# Patient Record
Sex: Male | Born: 1960 | Race: White | Hispanic: No | Smoking: Former smoker
Health system: Southern US, Community
[De-identification: ages and names within clinical notes are randomized; demographics above are authoritative.]

## PROBLEM LIST (undated history)

## (undated) DIAGNOSIS — K635 Polyp of colon: Secondary | ICD-10-CM

## (undated) DIAGNOSIS — M109 Gout, unspecified: Secondary | ICD-10-CM

## (undated) DIAGNOSIS — K449 Diaphragmatic hernia without obstruction or gangrene: Secondary | ICD-10-CM

## (undated) HISTORY — PX: APPENDECTOMY: SHX54

## (undated) HISTORY — PX: NM RENAL LASIX (ARMC HX): HXRAD1213

## (undated) HISTORY — DX: Gout, unspecified: M10.9

## (undated) HISTORY — DX: Diaphragmatic hernia without obstruction or gangrene: K44.9

## (undated) HISTORY — DX: Polyp of colon: K63.5

## (undated) HISTORY — PX: NASAL SINUS SURGERY: SHX719

---

## 2015-02-04 ENCOUNTER — Ambulatory Visit
Admit: 2015-02-04 | Disposition: A | Discharge status: Home / Self Care | Payer: Self-pay | Attending: Family Medicine | Admitting: Family Medicine

## 2015-11-05 ENCOUNTER — Telehealth: Payer: Self-pay | Admitting: Family Medicine

## 2015-11-05 NOTE — Telephone Encounter (Signed)
Pt said it was time for his colonoscopy and needs referral to [facility].  He said he could come by and sign to have records sent from gastro in [place].  His call back number is [phone]

## 2015-11-06 NOTE — Telephone Encounter (Signed)
LMTCB Per Amy, patient needs to call ins. Company and make sure they will pay for another colonoscopy within 2 years. Patient had a normal colonoscopy in 2015, 2010.

## 2015-11-22 ENCOUNTER — Telehealth: Payer: Self-pay | Admitting: *Deleted

## 2015-11-22 NOTE — Telephone Encounter (Signed)
Patient needs to come by and sign new medical records release for [facility] [place]. They will not release colonoscopy reports without it. They need release stating he is transfering care.

## 2015-12-02 ENCOUNTER — Encounter: Payer: Self-pay | Admitting: Family Medicine

## 2015-12-02 ENCOUNTER — Ambulatory Visit (INDEPENDENT_AMBULATORY_CARE_PROVIDER_SITE_OTHER): Payer: 59 | Admitting: Family Medicine

## 2015-12-02 VITALS — BP 121/81 | HR 58 | Temp 98.0°F | Resp 16 | Ht 73.0 in | Wt 212.0 lb

## 2015-12-02 DIAGNOSIS — J039 Acute tonsillitis, unspecified: Secondary | ICD-10-CM | POA: Diagnosis not present

## 2015-12-02 DIAGNOSIS — Z1211 Encounter for screening for malignant neoplasm of colon: Secondary | ICD-10-CM | POA: Diagnosis not present

## 2015-12-02 DIAGNOSIS — J029 Acute pharyngitis, unspecified: Secondary | ICD-10-CM | POA: Diagnosis not present

## 2015-12-02 LAB — POCT RAPID STREP A (OFFICE): Rapid Strep A Screen: NEGATIVE

## 2015-12-02 MED ORDER — PENICILLIN V POTASSIUM 500 MG PO TABS: 500.0000 mg | ORAL_TABLET | Freq: Three times a day (TID) | ORAL | Status: DC

## 2015-12-02 MED ORDER — PENICILLIN V POTASSIUM 500 MG PO TABS: 500.0000 mg | ORAL_TABLET | Freq: Three times a day (TID) | ORAL | Status: AC

## 2015-12-02 NOTE — Patient Instructions (Addendum)

## 2015-12-02 NOTE — Progress Notes (Signed)
Subjective:    Patient ID: Eric Hogan, male    DOB: 09/06/1961, 55 y.o.   MRN: [id number]  HPI: Eric Hogan is a 55 y.o. male presenting on 12/02/2015 for Sore Throat   HPI  Pt presents for sore throat and sinus congestion for about 2 weeks. Throat is very sore. No sinus congestion or HA. Some fatigue. Some "swollen glands" Pain to swallow at night. Most painful at night. Lots of post nasal drip and drainage. No regular medication.   Pt is also being told by his GI specialist in [place] that it is time for colonoscopy due to polyps. He is unsure of the last date of colonoscopy. We are requesting records from his [place] GI specialist. He would like colonoscopy at [facility].    Past Medical History  Diagnosis Date  . Gout   . Polyp of colon   . Hiatal hernia     No current outpatient prescriptions on file prior to visit.   No current facility-administered medications on file prior to visit.    Review of Systems  Constitutional: Negative for fever and chills.  HENT: Positive for postnasal drip, sore throat and trouble swallowing. Negative for congestion, ear pain, rhinorrhea and sinus pressure.   Respiratory: Negative for cough, chest tightness, shortness of breath and wheezing.   Cardiovascular: Negative for chest pain, palpitations and leg swelling.  Gastrointestinal: Negative for nausea, vomiting and abdominal pain.  Endocrine: Negative.   Genitourinary: Negative for dysuria, urgency, discharge, penile pain and testicular pain.  Musculoskeletal: Negative for back pain, joint swelling and arthralgias.  Skin: Negative.   Neurological: Negative for dizziness, weakness, numbness and headaches.  Psychiatric/Behavioral: Negative for sleep disturbance and dysphoric mood.   Per HPI unless specifically indicated above     Objective:    BP 121/81 mmHg  Pulse 58  Temp(Src) 98 F (36.7 C) (Oral)  Resp 16  Ht  (1.854 m)  Wt 212 lb (96.163 kg)  BMI 27.98 kg/m2  Wt  Readings from Last 3 Encounters:  12/02/15 212 lb (96.163 kg)    Physical Exam  HENT:  Head: Normocephalic and atraumatic.  Right Ear: Hearing and tympanic membrane normal.  Left Ear: Hearing and tympanic membrane normal.  Nose: No mucosal edema or rhinorrhea. Right sinus exhibits no maxillary sinus tenderness and no frontal sinus tenderness. Left sinus exhibits no maxillary sinus tenderness and no frontal sinus tenderness.  Mouth/Throat: Oropharyngeal exudate and posterior oropharyngeal erythema present.   Results for orders placed or performed in visit on 12/02/15  POCT rapid strep A  Result Value Ref Range   Rapid Strep A Screen Negative Negative      Assessment & Plan:   Problem List Items Addressed This Visit    None    Visit Diagnoses    Exudative tonsillitis    -  Primary    Treat for exudative tonsillitis given pt exudate on tonsils. Penicillin TID for 10 days. Supportive care at home. Return if symptoms not improving.     Relevant Medications    penicillin v potassium (VEETID) 500 MG tablet    Other Relevant Orders    POCT rapid strep A (Completed)    Grp A Strep    Screening for colon cancer        Relevant Orders    Ambulatory referral to Gastroenterology       Meds ordered this encounter  Medications  . DISCONTD: penicillin v potassium (VEETID) 500 MG tablet  Sig: Take 1 tablet (500 mg total) by mouth 3 (three) times daily.    Dispense:  30 tablet    Refill:  0    Order Specific Question:  Supervising Provider    Answer:  Janeann Forehand [phone]  . penicillin v potassium (VEETID) 500 MG tablet    Sig: Take 1 tablet (500 mg total) by mouth 3 (three) times daily.    Dispense:  30 tablet    Refill:  0    Order Specific Question:  Supervising Provider    Answer:  Janeann Forehand [[id number]]      Follow up plan: Return if symptoms worsen or fail to improve.

## 2015-12-04 LAB — CULTURE, GROUP A STREP: STREP A CULTURE: NEGATIVE

## 2016-03-03 ENCOUNTER — Telehealth: Payer: Self-pay | Admitting: Family Medicine

## 2016-03-03 NOTE — Telephone Encounter (Signed)
Amy at Dr. Orpah CobbSkulski's office called to see if there were records of pt's previous colonoscopy and or pathos report.  Her call back number is [phone] and fax [phone]

## 2016-03-03 NOTE — Telephone Encounter (Signed)
Called Amy back and let her know we have not received records from [place].

## 2016-05-12 ENCOUNTER — Encounter: Admission: RE | Payer: Self-pay | Source: Ambulatory Visit

## 2016-05-12 ENCOUNTER — Ambulatory Visit: Admission: RE | Admit: 2016-05-12 | Payer: 59 | Source: Ambulatory Visit | Admitting: Gastroenterology

## 2016-05-12 SURGERY — COLONOSCOPY WITH PROPOFOL
Anesthesia: General

## 2016-05-28 ENCOUNTER — Ambulatory Visit (INDEPENDENT_AMBULATORY_CARE_PROVIDER_SITE_OTHER): Payer: 59 | Admitting: Family Medicine

## 2016-05-28 ENCOUNTER — Ambulatory Visit
Admission: RE | Admit: 2016-05-28 | Discharge: 2016-05-28 | Disposition: A | Discharge status: Home / Self Care | Payer: 59 | Source: Ambulatory Visit | Attending: Family Medicine | Admitting: Family Medicine

## 2016-05-28 ENCOUNTER — Encounter: Payer: Self-pay | Admitting: Family Medicine

## 2016-05-28 ENCOUNTER — Telehealth: Payer: Self-pay | Admitting: Family Medicine

## 2016-05-28 VITALS — BP 118/72 | HR 69 | Temp 98.3°F | Resp 16 | Ht 73.0 in | Wt 210.0 lb

## 2016-05-28 DIAGNOSIS — R05 Cough: Secondary | ICD-10-CM | POA: Insufficient documentation

## 2016-05-28 DIAGNOSIS — R0781 Pleurodynia: Secondary | ICD-10-CM

## 2016-05-28 DIAGNOSIS — R059 Cough, unspecified: Secondary | ICD-10-CM

## 2016-05-28 MED ORDER — BENZONATATE 100 MG PO CAPS: 100.0000 mg | ORAL_CAPSULE | Freq: Three times a day (TID) | ORAL | 0 refills | Status: DC | PRN

## 2016-05-28 MED ORDER — PREDNISONE 20 MG PO TABS: 40.0000 mg | ORAL_TABLET | Freq: Every day | ORAL | 0 refills | Status: DC

## 2016-05-28 MED ORDER — DM-GUAIFENESIN ER 30-600 MG PO TB12: 1.0000 | ORAL_TABLET | Freq: Two times a day (BID) | ORAL | 0 refills | Status: DC

## 2016-05-28 NOTE — Progress Notes (Signed)
Subjective:    Patient ID: Eric Hogan, male    DOB: [birth year], 55 y.o.   MRN: [id number]  HPI: Eric Hogan is a 55 y.o. male presenting on 05/28/2016 for Sinusitis (drainge cough phlem comes up ticking tin throat wheezing cough is still there pt has follow up for ENT tomorrow after sinus surgery 2 years ago but wanted to check out cough and lungs )   HPI  Pt presents for cough. Has sore throat, URI symptoms 2 weeks ago. Cough is lingering. Moved into chest. Harder to take a deep breath. No chest tightness. Was wheezing. Still has cough. Cough is mostly non-productive. Dark brown sputum when does cough. Cough was worse at night. Chest does hurt when he coughs.    Going to ENT tomorrow for follow-up. Sinus issues and cough since surgery.   Past Medical History:  Diagnosis Date  . Gout   . Hiatal hernia   . Polyp of colon     No current outpatient prescriptions on file prior to visit.   No current facility-administered medications on file prior to visit.     Review of Systems  Constitutional: Negative for chills and fever.  HENT: Positive for congestion, ear pain, postnasal drip, rhinorrhea and sore throat. Negative for ear discharge, sinus pressure and trouble swallowing.   Respiratory: Positive for cough and wheezing. Negative for choking, chest tightness and shortness of breath.        Pain with deep inspiration.   Cardiovascular: Negative for chest pain and palpitations.  Gastrointestinal: Negative for abdominal pain, diarrhea, nausea and vomiting.  Musculoskeletal: Negative for myalgias, neck pain and neck stiffness.  Skin: Negative for rash.  Neurological: Negative for dizziness, syncope, weakness and headaches.   Per HPI unless specifically indicated above     Objective:    BP 118/72 (BP Location: Right Arm, Patient Position: Sitting, Cuff Size: Normal)   Pulse 69   Temp 98.3 F (36.8 C) (Oral)   Resp 16   Ht  (1.854 m)   Wt 210 lb (95.3 kg)   SpO2 98%    BMI 27.71 kg/m   Wt Readings from Last 3 Encounters:  05/28/16 210 lb (95.3 kg)  12/02/15 212 lb (96.2 kg)    Physical Exam  Constitutional: He appears well-developed and well-nourished. No distress.  HENT:  Head: Normocephalic and atraumatic.  Right Ear: Hearing and tympanic membrane normal.  Left Ear: Hearing normal.  Nose: Mucosal edema present. No rhinorrhea. Right sinus exhibits no maxillary sinus tenderness and no frontal sinus tenderness. Left sinus exhibits no maxillary sinus tenderness and no frontal sinus tenderness.  Mouth/Throat: Uvula is midline. Mucous membranes are not cyanotic. Posterior oropharyngeal erythema present.  Mildly erythematous throat.   Pulmonary/Chest: He has no decreased breath sounds. He has wheezes (with coughing only.) in the right lower field and the left lower field. He has no rhonchi. He has no rales. Chest wall is not dull to percussion. He exhibits no tenderness.  Skin: He is not diaphoretic.   Results for orders placed or performed in visit on 12/02/15  Culture, Group A Strep  Result Value Ref Range   Strep A Culture Negative   POCT rapid strep A  Result Value Ref Range   Rapid Strep A Screen Negative Negative      Assessment & Plan:   Problem List Items Addressed This Visit    None    Visit Diagnoses    Cough    -  Primary  Supportive care at home. Alarm symptoms reviewed. Return if not improving.    Relevant Medications   predniSONE (DELTASONE) 20 MG tablet   benzonatate (TESSALON) 100 MG capsule   dextromethorphan-guaiFENesin (MUCINEX DM) 30-600 MG 12hr tablet   Other Relevant Orders   DG Chest 2 View   Pleuritic pain       CXR to r/o pneumonia. Symptoms likely consistent with bronchitis. Will treat with prednisone. Supportive care at home. Alarm symptoms reviewed.    Relevant Orders   DG Chest 2 View      Meds ordered this encounter  Medications  . allopurinol (ZYLOPRIM) 100 MG tablet    Sig: Take by mouth.  .  predniSONE (DELTASONE) 20 MG tablet    Sig: Take 2 tablets (40 mg total) by mouth daily with breakfast.    Dispense:  10 tablet    Refill:  0    Order Specific Question:   Supervising Provider    Answer:   Janeann Forehand [phone]  . benzonatate (TESSALON) 100 MG capsule    Sig: Take 1 capsule (100 mg total) by mouth 3 (three) times daily as needed.    Dispense:  30 capsule    Refill:  0    Order Specific Question:   Supervising Provider    Answer:   Janeann Forehand [[id number]]  . dextromethorphan-guaiFENesin (MUCINEX DM) 30-600 MG 12hr tablet    Sig: Take 1 tablet by mouth 2 (two) times daily.    Dispense:  20 tablet    Refill:  0    Order Specific Question:   Supervising Provider    Answer:   Janeann Forehand [[id number]]      Follow up plan: No Follow-up on file.

## 2016-05-28 NOTE — Patient Instructions (Signed)
Please seek immediate medical attention if you develop shortness of breath not relieve by inhaler, chest pain/tightness, fever > 103 F or other concerning symptoms.   You can use supportive care at home to help with your symptoms. I have sent Mucinex DM to your pharmacy to help break up the congestion and soothe your cough. You can takes this twice daily.  I have also sent tesslon perles to your pharmacy to help with the cough- you can take these 3 times daily as needed. Honey is a natural cough suppressant- so add it to your tea in the morning.  If you have a humidifer, set that up in your bedroom at night.     

## 2016-05-28 NOTE — Telephone Encounter (Signed)
Called pt to review XR results. Negative chest XR.

## 2016-08-05 LAB — HM COLONOSCOPY

## 2017-09-02 ENCOUNTER — Encounter: Payer: Self-pay | Admitting: Nurse Practitioner

## 2017-09-02 ENCOUNTER — Ambulatory Visit (INDEPENDENT_AMBULATORY_CARE_PROVIDER_SITE_OTHER): Payer: 59 | Admitting: Nurse Practitioner

## 2017-09-02 VITALS — BP 109/61 | HR 66 | Temp 98.6°F | Resp 16 | Ht 73.0 in | Wt 207.8 lb

## 2017-09-02 DIAGNOSIS — J029 Acute pharyngitis, unspecified: Secondary | ICD-10-CM | POA: Diagnosis not present

## 2017-09-02 DIAGNOSIS — J00 Acute nasopharyngitis [common cold]: Secondary | ICD-10-CM

## 2017-09-02 LAB — POCT RAPID STREP A (OFFICE): Rapid Strep A Screen: NEGATIVE

## 2017-09-02 MED ORDER — FLUTICASONE PROPIONATE 50 MCG/ACT NA SUSP: 2.0000 | Freq: Every day | NASAL | 6 refills | Status: DC

## 2017-09-02 NOTE — Progress Notes (Signed)
Subjective:    Patient ID: Eric Mylaraul H Arey, male    DOB: Jul 05, 1961, 56 y.o.   MRN: [id number]  Eric Hogan is a 56 y.o. male presenting on 09/02/2017 for Sore Throat (hoarsness x 1 week, )   HPI URI Pt reports sore, scratchy throat and hoarseness with symptoms for the last 6-7 days w/ gradual worsening.  Worst in mornings when he wakes up.  Post-nasal drainage leads to "gagging and choking."  Rhinorrhea, but no significant nasal or sinus congestion.  Pt states he does feel like his "ears are plugged." - Pt denies fever, chills, sweats, nausea, vomiting, diarrhea and constipation. - No known sick contacts.  - Not taking any medications to manage symptoms.  Pt does have longstanding history of allergic rhinitis w/ prior ENT surgeries and management.  Has run out of his flonase several months ago and has not been taking this.   Social History   Tobacco Use  . Smoking status: Former Games developermoker  . Smokeless tobacco: Never Used  Substance Use Topics  . Alcohol use: Yes    Alcohol/week: 0.0 oz  . Drug use: No    Review of Systems Per HPI unless specifically indicated above     Objective:    BP 109/61 (BP Location: Right Arm, Patient Position: Sitting, Cuff Size: Normal)   Pulse 66   Temp 98.6 F (37 C) (Oral)   Resp 16   Ht 6\' 1"  (1.854 m)   Wt 207 lb 12.8 oz (94.3 kg)   SpO2 99%   BMI 27.42 kg/m   Wt Readings from Last 3 Encounters:  09/02/17 207 lb 12.8 oz (94.3 kg)  05/28/16 210 lb (95.3 kg)  12/02/15 212 lb (96.2 kg)    Physical Exam  Constitutional: He is oriented to person, place, and time. He appears well-developed and well-nourished. No distress.  HENT:  Head: Normocephalic and atraumatic.  Right Ear: Hearing, external ear and ear canal normal. Tympanic membrane is bulging.  Left Ear: Tympanic membrane is bulging.  Nose: Rhinorrhea present. Right sinus exhibits no maxillary sinus tenderness and no frontal sinus tenderness. Left sinus exhibits no maxillary sinus  tenderness and no frontal sinus tenderness.  Mouth/Throat: Uvula is midline and mucous membranes are normal. Posterior oropharyngeal edema (cobblestoning in appearance) present.  Neck: Normal range of motion. Neck supple. No JVD present. No tracheal deviation present. No thyromegaly present.  Cardiovascular: Normal rate, regular rhythm, normal heart sounds and intact distal pulses.  Pulmonary/Chest: Effort normal and breath sounds normal. No respiratory distress. He has no wheezes.  Lymphadenopathy:    He has cervical adenopathy (mild cervical LAD).  Neurological: He is alert and oriented to person, place, and time.  Skin: Skin is warm and dry.  Psychiatric: He has a normal mood and affect. His behavior is normal. Judgment and thought content normal.  Vitals reviewed.  Results for orders placed or performed in visit on 12/02/15  Culture, Group A Strep  Result Value Ref Range   Strep A Culture Negative   POCT rapid strep A  Result Value Ref Range   Rapid Strep A Screen Negative Negative      Assessment & Plan:   Problem List Items Addressed This Visit    None    Visit Diagnoses    Acute nasopharyngitis (common cold)    -  Primary Acute illness. Without fever. Symptoms gradually worsening since onset 6 days ago. Consistent with viral illness x 6 days with no known sick contacts and no  identifiable focal infections of ears, nose, throat.  Possibly complicated by allergic rhinitis currently unmanaged w/ Flonase as previously.  Plan: 1. Reassurance, likely self-limited with cough lasting up to few weeks - Start Cetirizine 10mg  daily,  - also can use Flonase 2 sprays each nostril daily for up to 4-6 weeks - Start Mucinex-DM OTC up to 7-10 days then stop - May also take OTC Sudafed up to 7 days then stop to control rhinorrhea (normal BP and HR). 2. Supportive care with nasal saline, warm herbal tea with honey, 3. Improve hydration 4. Tylenol / Motrin PRN fevers 5. Return criteria  given    Sore throat     POCT rapid strep to rule out strep pharyngitis.   Relevant Orders   POCT rapid strep A      Meds ordered this encounter  Medications  . DISCONTD: allopurinol (ZYLOPRIM) 100 MG tablet    Sig: Take by mouth.  . fluticasone (FLONASE) 50 MCG/ACT nasal spray    Sig: Place 2 sprays daily into both nostrils.    Dispense:  16 g    Refill:  6    Order Specific Question:   Supervising Provider    Answer:   Smitty CordsKARAMALEGOS, ALEXANDER J [[id number]]      Follow up plan: Return 5-10 if symptoms worsen or fail to improve.  Wilhelmina McardleLauren Hezakiah Champeau, DNP, AGPCNP-BC Adult Gerontology Primary Care Nurse Practitioner [facility] 09/02/2017, 3:36 PM

## 2017-09-02 NOTE — Patient Instructions (Addendum)
Eric Hogan, Thank you for coming in to clinic today.  1. It sounds like you have a Upper Respiratory Virus - this will most likely run it's course in 7 to 10 days. Recommend good hand washing. - Start anti-histamine Cetirizine 10mg  daily take for 2-3 weeks - also can use Flonase 2 sprays each nostril daily for up to 4-6 weeks - If congestion is worse, start OTC Mucinex (or may try Mucinex-DM for cough) up to 7-10 days then stop - For continued drainage, you can start behind the counter Sudafed.  Drink plenty of water. - Drink plenty of fluids to improve congestion - You may try over the counter Nasal Saline spray (Simply Saline, Ocean Spray) as needed to reduce congestion. - Drink warm herbal tea with honey for sore throat. - Start taking Tylenol extra strength 1 to 2 tablets every 6-8 hours for aches or fever/chills for next few days as needed.  Do not take more than 3,000 mg in 24 hours from all medicines.  May take Ibuprofen as well if tolerated 200-400mg  every 8 hours as needed.  If symptoms significantly worsening with persistent fevers/chills despite tylenol/ibpurofen, nausea, vomiting unable to tolerate food/fluids or medicine, body aches, or shortness of breath, sinus pain pressure or worsening productive cough, then follow-up for re-evaluation, may seek more immediate care at Urgent Care or ED if more concerned for emergency.  Please schedule a follow-up appointment with Wilhelmina McardleLauren Shamika Pedregon, AGNP. Return 5-10 if symptoms worsen or fail to improve.  If you have any other questions or concerns, please feel free to call the clinic or send a message through MyChart. You may also schedule an earlier appointment if necessary.  You will receive a survey after today's visit either digitally by e-mail or paper by [employer] mail. Your experiences and feedback matter to us.  Please respond so we know how we are doing as we provide care for you.   Wilhelmina McardleLauren Simcha Farrington, DNP, AGNP-BC Adult Gerontology Nurse  Practitioner [facility], [facility]

## 2018-01-06 ENCOUNTER — Encounter: Payer: Self-pay | Admitting: Nurse Practitioner

## 2018-01-06 ENCOUNTER — Other Ambulatory Visit: Payer: Self-pay

## 2018-01-06 ENCOUNTER — Ambulatory Visit (INDEPENDENT_AMBULATORY_CARE_PROVIDER_SITE_OTHER): Payer: Managed Care, Other (non HMO) | Admitting: Nurse Practitioner

## 2018-01-06 VITALS — BP 111/67 | HR 96 | Temp 99.4°F | Resp 16 | Ht 73.0 in | Wt 205.0 lb

## 2018-01-06 DIAGNOSIS — J0140 Acute pansinusitis, unspecified: Secondary | ICD-10-CM | POA: Diagnosis not present

## 2018-01-06 MED ORDER — AMOXICILLIN-POT CLAVULANATE 875-125 MG PO TABS: 1.0000 | ORAL_TABLET | Freq: Two times a day (BID) | ORAL | 0 refills | Status: AC

## 2018-01-06 NOTE — Progress Notes (Signed)
Subjective:    Patient ID: Eric Hogan, male    DOB: June 29, 1961, 57 y.o.   MRN: [id number]  Eric Hogan is a 57 y.o. male presenting on 01/06/2018 for Nasal Congestion (coughing, nasal drainage w/ bloody mucus, headache, dizziness and hoarseness x 10 days)   HPI  URI Patient presents today for nasal congestion and other symptoms of URI that began 10 days ago.  Started with weakness, malaise.  Then, patient noted he began having worsening nasal drainage with bloody mucus, headache, dizziness, hoarseness, cough.  Over the last 7 days, he has continued to get worse.  Cough is nonproductive.  He has significant nasal drainage with bloody mucous first in the morning. - Headache, dizziness, hoarseness started about 7 days ago. - No current medications, including OTC medications for symptoms. - Pt denies fever, chills, sweats, nausea, vomiting, diarrhea and constipation.  Social History   Tobacco Use  . Smoking status: Former Games developer  . Smokeless tobacco: Never Used  Substance Use Topics  . Alcohol use: Yes    Alcohol/week: 0.0 oz  . Drug use: No    Review of Systems Per HPI unless specifically indicated above     Objective:    BP 111/67 (BP Location: Right Arm, Patient Position: Sitting, Cuff Size: Large)   Pulse 96   Temp 99.4 F (37.4 C) (Oral)   Resp 16   Ht 6\' 1"  (1.854 m)   Wt 205 lb (93 kg)   SpO2 99%   BMI 27.05 kg/m   Wt Readings from Last 3 Encounters:  01/06/18 205 lb (93 kg)  09/02/17 207 lb 12.8 oz (94.3 kg)  05/28/16 210 lb (95.3 kg)    Physical Exam  Constitutional: He appears well-developed and well-nourished. He appears distressed (mildly).  HENT:  Head: Normocephalic and atraumatic.  Right Ear: Hearing, tympanic membrane, external ear and ear canal normal.  Left Ear: Hearing, tympanic membrane, external ear and ear canal normal.  Nose: Mucosal edema and rhinorrhea present. Right sinus exhibits maxillary sinus tenderness and frontal sinus  tenderness. Left sinus exhibits maxillary sinus tenderness and frontal sinus tenderness.  Mouth/Throat: Uvula is midline and mucous membranes are normal. Posterior oropharyngeal edema (cobblestoning) and posterior oropharyngeal erythema (Mildly injected) present. Oropharyngeal exudate: clear secretions.  Neck: Normal range of motion. Neck supple.  Cardiovascular: Normal rate, regular rhythm, S1 normal, S2 normal, normal heart sounds and intact distal pulses.  Pulmonary/Chest: Effort normal and breath sounds normal. No respiratory distress.  Lymphadenopathy:    He has cervical adenopathy.  Neurological: He is alert.  Skin: Skin is warm and dry.  Psychiatric: He has a normal mood and affect. His behavior is normal.  Vitals reviewed.  Results for orders placed or performed in visit on 09/02/17  POCT rapid strep A  Result Value Ref Range   Rapid Strep A Screen Negative Negative      Assessment & Plan:   Problem List Items Addressed This Visit    None    Visit Diagnoses    Acute non-recurrent pansinusitis    -  Primary    Consistent with URI and secondary sinusitis with symptoms worsening over the past 7 days and initial symptoms of nasal congestion and sinus pressure over 2 weeks ago.   Plan: 1.START taking Augmentin 875-125 mg tablets every 12 hours for 10 days.  Discussed completing antibiotic. - While on antibiotic, take a probiotic OTC or from food. -Start anti-histamine cetirizine 10mg  daily. - Can use Flonase 2 sprays each  nostril daily for up to 4-6 weeks if no epistaxis. - Start Mucinex-DM OTC for  7-10 days prn congestion 2. Supportive care with nasal saline, warm herbal tea with honey, 3. Improve hydration 4. Tylenol / Motrin PRN fevers  5. Return criteria given    Meds ordered this encounter  Medications  . amoxicillin-clavulanate (AUGMENTIN) 875-125 MG tablet    Sig: Take 1 tablet by mouth 2 (two) times daily for 10 days.    Dispense:  20 tablet    Refill:  0     Order Specific Question:   Supervising Provider    Answer:   Smitty CordsKARAMALEGOS, ALEXANDER J [[id number]]     Follow up plan: Return 5-7 days if symptoms worsen or fail to improve.   Eric McardleLauren Brynlie Daza, DNP, AGPCNP-BC Adult Gerontology Primary Care Nurse Practitioner [facility] 01/22/2018, 10:01 PM

## 2018-01-06 NOTE — Patient Instructions (Addendum)
Eric Hogan, Thank you for coming in to clinic today.  1. It sounds like you have an upper respiratory virus.  This will most likely run it's course in 7 to 10 days. Recommend good hand washing. - START Augmentin 875-125 mg tablet.  Take one tablet twice daily for 10 days.  - Start anti-histamine cetirizine 10mg  daily, also can use Flonase 2 sprays each nostril daily for up to 4-6 weeks - If congestion is worse, start OTC Mucinex (or may try Mucinex-DM for cough) up to 7-10 days then stop. - Drink plenty of fluids to improve congestion. - You may try over the counter Nasal Saline spray (Simply Saline, Ocean Spray) as needed to reduce congestion. - Drink warm herbal tea with honey for sore throat. - Start taking Tylenol extra strength 1 to 2 tablets every 6-8 hours for aches or fever/chills for next few days as needed.  Do not take more than 3,000 mg in 24 hours from all medicines.  May take Ibuprofen as well if tolerated 200-400mg  every 8 hours as needed.  If symptoms significantly worsening with persistent fevers/chills despite tylenol/ibpurofen, nausea, vomiting unable to tolerate food/fluids or medicine, body aches, or shortness of breath, sinus pain pressure or worsening productive cough, then follow-up for re-evaluation, may seek more immediate care at Urgent Care or ED if more concerned for emergency.  Please schedule a follow-up appointment with Wilhelmina McardleLauren Trinidee Schrag, AGNP. Return 5-7 days if symptoms worsen or fail to improve.  If you have any other questions or concerns, please feel free to call the clinic or send a message through MyChart. You may also schedule an earlier appointment if necessary.  You will receive a survey after today's visit either digitally by e-mail or paper by [employer] mail. Your experiences and feedback matter to us.  Please respond so we know how we are doing as we provide care for you.   Wilhelmina McardleLauren Grayce Budden, DNP, AGNP-BC Adult Gerontology Nurse Practitioner [facility], [facility]

## 2018-01-22 ENCOUNTER — Encounter: Payer: Self-pay | Admitting: Nurse Practitioner

## 2018-04-22 ENCOUNTER — Ambulatory Visit (INDEPENDENT_AMBULATORY_CARE_PROVIDER_SITE_OTHER): Payer: Managed Care, Other (non HMO) | Admitting: Nurse Practitioner

## 2018-04-22 ENCOUNTER — Encounter: Payer: Self-pay | Admitting: Nurse Practitioner

## 2018-04-22 ENCOUNTER — Other Ambulatory Visit: Payer: Self-pay

## 2018-04-22 VITALS — BP 124/75 | HR 79 | Temp 98.3°F | Ht 73.0 in | Wt 211.0 lb

## 2018-04-22 DIAGNOSIS — R197 Diarrhea, unspecified: Secondary | ICD-10-CM | POA: Diagnosis not present

## 2018-04-22 DIAGNOSIS — R160 Hepatomegaly, not elsewhere classified: Secondary | ICD-10-CM

## 2018-04-22 DIAGNOSIS — R82998 Other abnormal findings in urine: Secondary | ICD-10-CM | POA: Diagnosis not present

## 2018-04-22 DIAGNOSIS — N200 Calculus of kidney: Secondary | ICD-10-CM | POA: Insufficient documentation

## 2018-04-22 DIAGNOSIS — R109 Unspecified abdominal pain: Secondary | ICD-10-CM

## 2018-04-22 LAB — POCT URINALYSIS DIPSTICK
Bilirubin, UA: NEGATIVE
Glucose, UA: NEGATIVE
Ketones, UA: NEGATIVE
Leukocytes, UA: NEGATIVE
Nitrite, UA: NEGATIVE
Protein, UA: NEGATIVE
Spec Grav, UA: 1.02 (ref 1.010–1.025)
Urobilinogen, UA: 0.2 E.U./dL
pH, UA: 5 (ref 5.0–8.0)

## 2018-04-22 NOTE — Patient Instructions (Addendum)
Eric Hogan,   Thank you for coming in to clinic today.  1. You may have a kidney stone. - Drink plenty of water - Take tylenol or ibuprofen if needed.   - If pain very severe again, seek care at Urgent Care first or Emergency Room if all other offices are closed.  Please schedule a follow-up appointment with Wilhelmina Mcardle, AGNP. Return 2-4 weeks if symptoms worsen or fail to improve.  If you have any other questions or concerns, please feel free to call the clinic or send a message through MyChart. You may also schedule an earlier appointment if necessary.  You will receive a survey after today's visit either digitally by e-mail or paper by [employer]. Your experiences and feedback matter to [place].  Please respond so we know how we are doing as we provide care for you.   Wilhelmina Mcardle, DNP, AGNP-BC Adult Gerontology Nurse Practitioner [facility], [facility]   Kidney Stones Kidney stones (urolithiasis) are solid, rock-like deposits that form inside of the organs that make urine (kidneys). A kidney stone may form in a kidney and move into the bladder, where it can cause intense pain and block the flow of urine. Kidney stones are created when high levels of certain minerals are found in the urine. They are usually passed through urination, but in some cases, medical treatment may be needed to remove them. What are the causes? Kidney stones may be caused by:  A condition in which certain glands produce too much parathyroid hormone (primary hyperparathyroidism), which causes too much calcium buildup in the blood.  Buildup of uric acid crystals in the bladder (hyperuricosuria). Uric acid is a chemical that the body produces when you eat certain foods. It usually exits the body in the urine.  Narrowing (stricture) of one or both of the tubes that drain urine from the kidneys to the bladder (ureters).  A kidney blockage that is present at birth (congenital obstruction).  Past  surgery on the kidney or the ureters, such as gastric bypass surgery.  What increases the risk? The following factors make you more likely to develop kidney stones:  Having had a kidney stone in the past.  Having a family history of kidney stones.  Not drinking enough water.  Eating a diet that is high in protein, salt (sodium), or sugar.  Being overweight or obese.  What are the signs or symptoms? Symptoms of a kidney stone may include:  Nausea.  Vomiting.  Blood in the urine (hematuria).  Pain in the side of the abdomen, right below the ribs (flank pain). Pain usually spreads (radiates) to the groin.  Needing to urinate frequently or urgently.  How is this diagnosed? This condition may be diagnosed based on:  Your medical history.  A physical exam.  Blood tests.  Urine tests.  CT scan.  Abdominal X-ray.  A procedure to examine the inside of the bladder (cystoscopy).  How is this treated? Treatment for kidney stones depends on the size, location, and makeup of the stones. Treatment may involve:  Analyzing your urine before and after you pass the stone through urination.  Being monitored at the hospital until you pass the stone through urination.  Increasing your fluid intake and decreasing the amount of calcium and protein in your diet.  A procedure to break up kidney stones in the bladder using: ? A focused beam of light (laser therapy). ? Shock waves (extracorporeal shock wave lithotripsy).  Surgery to remove kidney  stones. This may be needed if you have severe pain or have stones that block your urinary tract.  Follow these instructions at home: Eating and drinking   Drink enough fluid to keep your urine clear or pale yellow. This will help you to pass the kidney stone.  If directed, change your diet. This may include: ? Limiting how much sodium you eat. ? Eating more fruits and vegetables. ? Limiting how much meat, poultry, fish, and eggs you  eat.  Follow instructions from your health care provider about eating or drinking restrictions. General instructions  Collect urine samples as told by your health care provider. You may need to collect a urine sample: ? 24 hours after you pass the stone. ? 8-12 weeks after passing the kidney stone, and every 6-12 months after that.  Strain your urine every time you urinate, for as long as directed. Use the strainer that your health care provider recommends.  Do not throw out the kidney stone after passing it. Keep the stone so it can be tested by your health care provider. Testing the makeup of your kidney stone may help prevent you from getting kidney stones in the future.  Take over-the-counter and prescription medicines only as told by your health care provider.  Keep all follow-up visits as told by your health care provider. This is important. You may need follow-up X-rays or ultrasounds to make sure that your stone has passed. How is this prevented? To prevent another kidney stone:  Drink enough fluid to keep your urine clear or pale yellow. This is the best way to prevent kidney stones.  Eat a healthy diet and follow recommendations from your health care provider about foods to avoid. You may be instructed to eat a low-protein diet. Recommendations vary depending on the type of kidney stone that you have.  Maintain a healthy weight.  Contact a health care provider if:  You have pain that gets worse or does not get better with medicine. Get help right away if:  You have a fever or chills.  You develop severe pain.  You develop new abdominal pain.  You faint.  You are unable to urinate. This information is not intended to replace advice given to you by your health care provider. Make sure you discuss any questions you have with your health care provider. Document Released: 10/12/2005 Document Revised: 05/01/2016 Document Reviewed: 03/27/2016 Elsevier Interactive Patient  Education  [employer]2018 Elsevier Inc.

## 2018-04-22 NOTE — Progress Notes (Signed)
Subjective:    Patient ID: Eric Hogan, male    DOB: 02/11/1961, 57 y.o.   MRN: [id number]  Eric Hogan is a 57 y.o. male presenting on 04/22/2018 for Abdominal Pain (x 3-4 weeks ago, but it subsided for about a week and then returned for a day a week later and  subsided. On yesterday, severe left side flank pain w/ gross hematuria.  Pt states this not the first time he notice blood in urine. )   HPI Abdominal Pain Pt reports mild abdominal pain with diarrhea approximately 4 weeks ago he attributed to food poisoning.  He has continued to have diarrhea intermittently every 1-2 weeks.  Returned again yesterday, but was also associated with severe left side flank and pelvic pain with darkening of urine.  Denies any gross hematuria.  He reports single drop of bloody urine several years ago, but no other gross hematuria.  He has never had blood with bright red color in toilet bowl.   - No prior kidney stones. - Pt denies dysuria, burning on urination, bladder pressure, urinary frequency, and difficulty with urination. - Pt denies fever, chills, sweats, nausea, vomiting, and constipation.   Social History   Tobacco Use  . Smoking status: Former Smoker    Last attempt to quit: 02/20/2018    Years since quitting: 0.1  . Smokeless tobacco: Never Used  Substance Use Topics  . Alcohol use: Yes    Alcohol/week: 0.0 oz  . Drug use: No    Review of Systems Per HPI unless specifically indicated above     Objective:    BP 124/75 (BP Location: Right Arm, Patient Position: Sitting, Cuff Size: Normal)   Pulse 79   Temp 98.3 F (36.8 C) (Oral)   Ht 6\' 1"  (1.854 m)   Wt 211 lb (95.7 kg)   BMI 27.84 kg/m   Wt Readings from Last 3 Encounters:  04/22/18 211 lb (95.7 kg)  01/06/18 205 lb (93 kg)  09/02/17 207 lb 12.8 oz (94.3 kg)    Physical Exam  Constitutional: He is oriented to person, place, and time. He appears well-developed and well-nourished. No distress.  HENT:  Head:  Normocephalic and atraumatic.  Cardiovascular: Normal rate, regular rhythm, S1 normal, S2 normal, normal heart sounds and intact distal pulses.  Pulmonary/Chest: Effort normal and breath sounds normal. No respiratory distress.  Abdominal: Soft. Normal appearance. He exhibits no distension, no pulsatile liver and no mass. Bowel sounds are increased. There is no hepatosplenomegaly. There is generalized tenderness (sharply tender LLQ near iliac crest) and tenderness in the left lower quadrant. There is no rigidity, no rebound, no guarding, no CVA tenderness, no tenderness at McBurney's point and negative Murphy's sign. No hernia.  Neurological: He is alert and oriented to person, place, and time.  Skin: Skin is warm and dry. Capillary refill takes less than 2 seconds.  Psychiatric: He has a normal mood and affect. His behavior is normal.  Vitals reviewed.   Results for orders placed or performed in visit on 04/22/18  Urinalysis, microscopic only  Result Value Ref Range   WBC, UA NONE SEEN 0 - 5 /HPF   RBC / HPF 0-2 0 - 2 /HPF   Squamous Epithelial / LPF NONE SEEN < OR = 5 /HPF   Bacteria, UA NONE SEEN NONE SEEN /HPF   Hyaline Cast NONE SEEN NONE SEEN /LPF  POCT Urinalysis Dipstick  Result Value Ref Range   Color, UA dark orange  Clarity, UA clear    Glucose, UA Negative Negative   Bilirubin, UA negative    Ketones, UA negative    Spec Grav, UA 1.020 1.010 - 1.025   Blood, UA large    pH, UA 5.0 5.0 - 8.0   Protein, UA Negative Negative   Urobilinogen, UA 0.2 0.2 or 1.0 E.U./dL   Nitrite, UA negative    Leukocytes, UA Negative Negative   Appearance     Odor        Assessment & Plan:   Problem List Items Addressed This Visit    None    Visit Diagnoses    Dark brown urine    -  Primary   Relevant Orders   Urine Culture   POCT Urinalysis Dipstick (Completed)   CT RENAL STONE STUDY   Urinalysis, microscopic only (Completed)   Acute left flank pain       Relevant Orders    Urine Culture   POCT Urinalysis Dipstick (Completed)   CT RENAL STONE STUDY   Urinalysis, microscopic only (Completed)   Diarrhea, unspecified type        #1 - Acute left flank and pelvic pain 1 day ago.  Now resolved associated with dark brown urine.  Likely nephrolithiasis, but cannot exclude urinary tract infection or malignancy as POCT UA positive for blood. - POCT UA - Urine culture and micro to confirm blood - CT renal stone study - Referral urology - CMP, CBC  #2 - Diarrhea - No resolved, but is recurring over last 1 month.  Cannot exclude IBS/IBD/functional diarrhea.  Unlikely infectious. - Increase fiber supplement similar to metamucil to 1/2 dose daily (pt currently taking 1/4 dose) - Consider GI referral. Last colonoscopy 2017 was negative and likely symptoms are not related to malignancy. - Followup if symptoms persist.  Follow up plan: Return 2-4 weeks if symptoms worsen or fail to improve.  Wilhelmina McardleLauren Saadia Dewitt, DNP, AGPCNP-BC Adult Gerontology Primary Care Nurse Practitioner [facility] 04/23/2018, 2:43 PM

## 2018-04-23 ENCOUNTER — Encounter: Payer: Self-pay | Admitting: Nurse Practitioner

## 2018-04-23 LAB — URINALYSIS, MICROSCOPIC ONLY
Bacteria, UA: NONE SEEN /HPF
Hyaline Cast: NONE SEEN /LPF
Squamous Epithelial / LPF: NONE SEEN /HPF (ref <=5)
WBC, UA: NONE SEEN /HPF (ref 0–5)

## 2018-04-24 LAB — URINE CULTURE
MICRO NUMBER:: 90777682
SPECIMEN QUALITY:: ADEQUATE

## 2018-04-25 ENCOUNTER — Telehealth: Payer: Self-pay

## 2018-04-25 NOTE — Telephone Encounter (Signed)
Attempted to contact the pt, no answer. LMOM to return my call.  

## 2018-04-25 NOTE — Telephone Encounter (Signed)
Yes. The bloodwork is still needed to screen kidney function and calcium levels that could be changed or impact future stone risk.

## 2018-04-25 NOTE — Telephone Encounter (Signed)
The pt was notified of your request for labs. The pt state he past two stones over the weekend and wanted to know if he should still get the bloodwork.

## 2018-04-25 NOTE — Telephone Encounter (Signed)
-----   Message from Eric ManilaLauren Renee Kennedy, NP sent at 04/23/2018  2:48 PM EDT ----- Regarding: Add labs to plan Please inform [name] I have decided to draw labs to check blood counts, kidney, liver, and electrolyte function for additional evaluation.  He [facility]ing [facility] or change to [facility] if he prefers.

## 2018-04-26 ENCOUNTER — Encounter: Payer: Self-pay | Admitting: Nurse Practitioner

## 2018-04-26 ENCOUNTER — Other Ambulatory Visit: Payer: Self-pay | Admitting: Nurse Practitioner

## 2018-04-26 DIAGNOSIS — R197 Diarrhea, unspecified: Secondary | ICD-10-CM

## 2018-04-26 DIAGNOSIS — R82998 Other abnormal findings in urine: Secondary | ICD-10-CM

## 2018-04-26 DIAGNOSIS — R109 Unspecified abdominal pain: Secondary | ICD-10-CM

## 2018-04-26 DIAGNOSIS — K449 Diaphragmatic hernia without obstruction or gangrene: Secondary | ICD-10-CM | POA: Insufficient documentation

## 2018-04-26 NOTE — Telephone Encounter (Signed)
patient advised as per Leotis ShamesLauren wanted to go to [facility] for blood work.

## 2018-04-26 NOTE — Telephone Encounter (Signed)
Left message for patient to call back  

## 2018-04-28 LAB — CBC WITH DIFFERENTIAL/PLATELET
Basophils Absolute: 0 10*3/uL (ref 0.0–0.2)
Basos: 1 %
EOS (ABSOLUTE): 0.3 10*3/uL (ref 0.0–0.4)
Eos: 4 %
Hematocrit: 41 % (ref 37.5–51.0)
Hemoglobin: 14.3 g/dL (ref 13.0–17.7)
Immature Grans (Abs): 0.1 10*3/uL (ref 0.0–0.1)
Immature Granulocytes: 1 %
Lymphocytes Absolute: 1.6 10*3/uL (ref 0.7–3.1)
Lymphs: 22 %
MCH: 30.8 pg (ref 26.6–33.0)
MCHC: 34.9 g/dL (ref 31.5–35.7)
MCV: 88 fL (ref 79–97)
Monocytes Absolute: 0.5 10*3/uL (ref 0.1–0.9)
Monocytes: 7 %
Neutrophils Absolute: 4.8 10*3/uL (ref 1.4–7.0)
Neutrophils: 65 %
Platelets: 212 10*3/uL (ref 150–450)
RBC: 4.64 x10E6/uL (ref 4.14–5.80)
RDW: 13.2 % (ref 12.3–15.4)
WBC: 7.3 10*3/uL (ref 3.4–10.8)

## 2018-04-28 LAB — COMPREHENSIVE METABOLIC PANEL
ALT: 37 IU/L (ref 0–44)
AST: 23 IU/L (ref 0–40)
Albumin/Globulin Ratio: 2.2 (ref 1.2–2.2)
Albumin: 4.6 g/dL (ref 3.5–5.5)
Alkaline Phosphatase: 87 IU/L (ref 39–117)
BUN/Creatinine Ratio: 14 (ref 9–20)
BUN: 13 mg/dL (ref 6–24)
Bilirubin Total: 0.2 mg/dL (ref 0.0–1.2)
CO2: 21 mmol/L (ref 20–29)
Calcium: 9.2 mg/dL (ref 8.7–10.2)
Chloride: 103 mmol/L (ref 96–106)
Creatinine, Ser: 0.93 mg/dL (ref 0.76–1.27)
GFR calc Af Amer: 106 mL/min/{1.73_m2} (ref >59)
GFR calc non Af Amer: 92 mL/min/{1.73_m2} (ref >59)
Globulin, Total: 2.1 g/dL (ref 1.5–4.5)
Glucose: 127 mg/dL — ABNORMAL HIGH (ref 65–99)
Potassium: 4.3 mmol/L (ref 3.5–5.2)
Sodium: 140 mmol/L (ref 134–144)
Total Protein: 6.7 g/dL (ref 6.0–8.5)

## 2018-05-05 ENCOUNTER — Telehealth: Payer: Self-pay

## 2018-05-05 NOTE — Telephone Encounter (Signed)
We can defer CT study to Urology appointment.  Thanks for doing PA.

## 2018-05-05 NOTE — Telephone Encounter (Signed)
The pt Ct Soundra PilonStone Study is requiring a Audiological scientisteer-to-Peer [phone]. The pt is scheduled [facility]) in about a week.

## 2018-05-06 NOTE — Telephone Encounter (Signed)
The CT scan was approved and scheduled. Pending notification to patient.

## 2018-05-13 ENCOUNTER — Telehealth: Payer: Self-pay | Admitting: Family Medicine

## 2018-05-13 ENCOUNTER — Ambulatory Visit
Admission: RE | Admit: 2018-05-13 | Discharge: 2018-05-13 | Disposition: A | Discharge status: Home / Self Care | Payer: Managed Care, Other (non HMO) | Source: Ambulatory Visit | Attending: Nurse Practitioner | Admitting: Nurse Practitioner

## 2018-05-13 DIAGNOSIS — R911 Solitary pulmonary nodule: Secondary | ICD-10-CM | POA: Insufficient documentation

## 2018-05-13 DIAGNOSIS — I700 Atherosclerosis of aorta: Secondary | ICD-10-CM | POA: Insufficient documentation

## 2018-05-13 DIAGNOSIS — K769 Liver disease, unspecified: Secondary | ICD-10-CM | POA: Insufficient documentation

## 2018-05-13 DIAGNOSIS — M879 Osteonecrosis, unspecified: Secondary | ICD-10-CM | POA: Diagnosis not present

## 2018-05-13 DIAGNOSIS — R109 Unspecified abdominal pain: Secondary | ICD-10-CM | POA: Diagnosis present

## 2018-05-13 DIAGNOSIS — R82998 Other abnormal findings in urine: Secondary | ICD-10-CM | POA: Insufficient documentation

## 2018-05-13 DIAGNOSIS — N200 Calculus of kidney: Secondary | ICD-10-CM | POA: Insufficient documentation

## 2018-05-13 NOTE — Telephone Encounter (Signed)
Pt has a CT scheduled for 3:00 today and he said his insurance company needs more information.  Pt's call back number is [phone]

## 2018-05-13 NOTE — Telephone Encounter (Signed)
I sent a skye message to Retta Dionesenise Beasley Referral Coordinator concerning this message.

## 2018-05-17 ENCOUNTER — Telehealth: Payer: Self-pay | Admitting: Student

## 2018-05-17 NOTE — Telephone Encounter (Signed)
Pt asked if Lauren had been able to look at CT results and if he needed to come in for the referrals.  Please call  [phone]

## 2018-05-18 NOTE — Addendum Note (Signed)
Addended by: Wilhelmina McardleKENNEDY, Xaden Kaufman R on: 05/18/2018 01:30 PM   Modules accepted: Orders

## 2018-05-18 NOTE — Telephone Encounter (Signed)
Results are reviewed and released to MyChart.  Called patient with no answer.  LM to return call if questions remain.  Review of chart after release of results shows referral to Dr. [facility]is [facility] has already been made.  - I will order RUQ ultrasound if patient desires to proceed with additional workup of liver mass.

## 2018-05-18 NOTE — Telephone Encounter (Signed)
Incoming message concerning CT Stone study

## 2018-05-23 ENCOUNTER — Ambulatory Visit
Admission: RE | Admit: 2018-05-23 | Discharge: 2018-05-23 | Disposition: A | Discharge status: Home / Self Care | Payer: Managed Care, Other (non HMO) | Source: Ambulatory Visit | Attending: Nurse Practitioner | Admitting: Nurse Practitioner

## 2018-05-23 DIAGNOSIS — R160 Hepatomegaly, not elsewhere classified: Secondary | ICD-10-CM | POA: Insufficient documentation

## 2018-05-25 ENCOUNTER — Encounter: Payer: Self-pay | Admitting: Nurse Practitioner

## 2018-05-25 ENCOUNTER — Other Ambulatory Visit: Payer: Self-pay | Admitting: Nurse Practitioner

## 2018-05-25 ENCOUNTER — Other Ambulatory Visit: Payer: Self-pay

## 2018-05-25 ENCOUNTER — Ambulatory Visit (INDEPENDENT_AMBULATORY_CARE_PROVIDER_SITE_OTHER): Payer: Managed Care, Other (non HMO) | Admitting: Nurse Practitioner

## 2018-05-25 VITALS — BP 128/70 | HR 60 | Temp 98.9°F | Ht 73.0 in | Wt 212.8 lb

## 2018-05-25 DIAGNOSIS — N200 Calculus of kidney: Secondary | ICD-10-CM | POA: Diagnosis not present

## 2018-05-25 DIAGNOSIS — Z0000 Encounter for general adult medical examination without abnormal findings: Secondary | ICD-10-CM

## 2018-05-25 DIAGNOSIS — E782 Mixed hyperlipidemia: Secondary | ICD-10-CM

## 2018-05-25 NOTE — Patient Instructions (Addendum)
Eric Hogan,   Thank you for coming in to clinic today.  1. Shingles vaccine - Shingrix - [email or web address].[place]CDC.gov for more information  2. Repeat CT scan for lung nodule followup in 1 year will be discussed at next year's physical.  3. Repeat liver ultrasound in 6 months.  Order placed today.  They will call to schedule.  Please schedule a follow-up appointment with Wilhelmina McardleLauren Juhi Lagrange, AGNP. Return in about 1 year (around 05/26/2019) for annual physical.  If you have any other questions or concerns, please feel free to call the clinic or send a message through MyChart. You may also schedule an earlier appointment if necessary.  You will receive a survey after today's visit either digitally by e-mail or paper by [employer] mail. Your experiences and feedback matter to us.  Please respond so we know how we are doing as we provide care for you.   Wilhelmina McardleLauren Violette Morneault, DNP, AGNP-BC Adult Gerontology Nurse Practitioner [facility], [facility]   Dietary Guidelines to Help Prevent Kidney Stones Kidney stones are deposits of minerals and salts that form inside your kidneys. Your risk of developing kidney stones may be greater depending on your diet, your lifestyle, the medicines you take, and whether you have certain medical conditions. Most people can reduce their chances of developing kidney stones by following the instructions below. Depending on your overall health and the type of kidney stones you tend to develop, your dietitian may give you more specific instructions. What are tips for following this plan? Reading food labels  Choose foods with "no salt added" or "low-salt" labels. Limit your sodium intake to less than 2000 mg per day.  Choose foods with calcium for each meal and snack. Try to eat about 300 mg of calcium at each meal. Foods that contain 200-500 mg of calcium per serving include: ? 8 oz (237 ml) of milk, fortified nondairy milk, and fortified fruit juice. ? 8 oz (237 ml) of kefir,  yogurt, and soy yogurt. ? 4 oz (118 ml) of tofu. ? 1 oz of cheese. ? 1 cup (300 g) of dried figs. ? 1 cup (91 g) of cooked broccoli. ? 1-3 oz can of sardines or mackerel.  Most people need 1000 to 1500 mg of calcium each day. Talk to your dietitian about how much calcium is recommended for you. Shopping  Buy plenty of fresh fruits and vegetables. Most people do not need to avoid fruits and vegetables, even if they contain nutrients that may contribute to kidney stones.  When shopping for convenience foods, choose: ? Whole pieces of fruit. ? Premade salads with dressing on the side. ? Low-fat fruit and yogurt smoothies.  Avoid buying frozen meals or prepared deli foods.  Look for foods with live cultures, such as yogurt and kefir. Cooking  Do not add salt to food when cooking. Place a salt shaker on the table and allow each person to add his or her own salt to taste.  Use vegetable protein, such as beans, textured vegetable protein (TVP), or tofu instead of meat in pasta, casseroles, and soups. Meal planning  Eat less salt, if told by your dietitian. To do this: ? Avoid eating processed or premade food. ? Avoid eating fast food.  Eat less animal protein, including cheese, meat, poultry, or fish, if told by your dietitian. To do this: ? Limit the number of times you have meat, poultry, fish, or cheese each week. Eat a diet free of meat at least 2 days  a week. ? Eat only one serving each day of meat, poultry, fish, or seafood. ? When you prepare animal protein, cut pieces into small portion sizes. For most meat and fish, one serving is about the size of one deck of cards.  Eat at least 5 servings of fresh fruits and vegetables each day. To do this: ? Keep fruits and vegetables on hand for snacks. ? Eat 1 piece of fruit or a handful of berries with breakfast. ? Have a salad and fruit at lunch. ? Have two kinds of vegetables at dinner.  Limit foods that are high in a substance  called oxalate. These include: ? Spinach. ? Rhubarb. ? Beets. ? Potato chips and french fries. ? Nuts.  If you regularly take a diuretic medicine, make sure to eat at least 1-2 fruits or vegetables high in potassium each day. These include: ? Avocado. ? Banana. ? Orange, prune, carrot, or tomato juice. ? Baked potato. ? Cabbage. ? Beans and split peas. General instructions  Drink enough fluid to keep your urine clear or pale yellow. This is the most important thing you can do.  Talk to your health care provider and dietitian about taking daily supplements. Depending on your health and the cause of your kidney stones, you may be advised: ? Not to take supplements with vitamin C. ? To take a calcium supplement. ? To take a daily probiotic supplement. ? To take other supplements such as magnesium, fish oil, or vitamin B6.  Take all medicines and supplements as told by your health care provider.  Limit alcohol intake to no more than 1 drink a day for nonpregnant women and 2 drinks a day for men. One drink equals 12 oz of beer, 5 oz of wine, or 1 oz of hard liquor.  Lose weight if told by your health care provider. Work with your dietitian to find strategies and an eating plan that works best for you. What foods are not recommended? Limit your intake of the following foods, or as told by your dietitian. Talk to your dietitian about specific foods you should avoid based on the type of kidney stones and your overall health. Grains Breads. Bagels. Rolls. Baked goods. Salted crackers. Cereal. Pasta. Vegetables Spinach. Rhubarb. Beets. Canned vegetables. Rosita Fire. Olives. Meats and other protein foods Nuts. Nut butters. Large portions of meat, poultry, or fish. Salted or cured meats. Deli meats. Hot dogs. Sausages. Dairy Cheese. Beverages Regular soft drinks. Regular vegetable juice. Seasonings and other foods Seasoning blends with salt. Salad dressings. Canned soups. Soy sauce.  Ketchup. Barbecue sauce. Canned pasta sauce. Casseroles. Pizza. Lasagna. Frozen meals. Potato chips. [place] fries. Summary  You can reduce your risk of kidney stones by making changes to your diet.  The most important thing you can do is drink enough fluid. You should drink enough fluid to keep your urine clear or pale yellow.  Ask your health care provider or dietitian how much protein from animal sources you should eat each day, and also how much salt and calcium you should have each day. This information is not intended to replace advice given to you by your health care provider. Make sure you discuss any questions you have with your health care provider. Document Released: 02/06/2011 Document Revised: 09/22/2016 Document Reviewed: 09/22/2016 Elsevier Interactive Patient Education  [employer].

## 2018-05-25 NOTE — Progress Notes (Signed)
Subjective:    Patient ID: Eric Hogan, male    DOB: 11-19-1960, 57 y.o.   MRN: [id number]  Eric Hogan is a 57 y.o. male presenting on 05/25/2018 for Annual Exam   HPI Annual Physical Exam Patient has been feeling well.  They have no acute concerns today.  Review of test findings again.  Lingering concern for patient is aortic atherosclerosis with family history of ASCVD.  HEALTH MAINTENANCE: Weight/BMI: overweight, body habitus without central adiposity Physical activity: Rare breakfast, coffee.  Lunch from home or meals out 2-3 x per week, Dinner usually cooked at home out about 2-3 times per week but limits servings. Diet: generally healthy with fruits and vegetables, reducing fried foods. Seatbelt: always Sunscreen: rarely Prostate exam/PSA: lab only today, last year DRE normal Colon Cancer Screen: 2017 HIV/HEP C: due today Optometry: regularly Dentistry: regular  VACCINES: Tetanus: 2016 Shingles: discussed - declines for today    Past Medical History:  Diagnosis Date  . Gout   . Hiatal hernia   . Polyp of colon    Past Surgical History:  Procedure Laterality Date  . APPENDECTOMY    . NASAL SINUS SURGERY     Social History   Socioeconomic History  . Marital status: Divorced    Spouse name: Not on file  . Number of children: Not on file  . Years of education: Not on file  . Highest education level: Not on file  Occupational History  . Not on file  Social Needs  . Financial resource strain: Not on file  . Food insecurity:    Worry: Not on file    Inability: Not on file  . Transportation needs:    Medical: Not on file    Non-medical: Not on file  Tobacco Use  . Smoking status: Former Smoker    Last attempt to quit: 02/20/2018    Years since quitting: 0.2  . Smokeless tobacco: Never Used  Substance and Sexual Activity  . Alcohol use: Yes    Alcohol/week: 0.0 oz  . Drug use: No  . Sexual activity: Not on file  Lifestyle  . Physical activity:     Days per week: Not on file    Minutes per session: Not on file  . Stress: Not on file  Relationships  . Social connections:    Talks on phone: Not on file    Gets together: Not on file    Attends religious service: Not on file    Active member of club or organization: Not on file    Attends meetings of clubs or organizations: Not on file    Relationship status: Not on file  . Intimate partner violence:    Fear of current or ex partner: Not on file    Emotionally abused: Not on file    Physically abused: Not on file    Forced sexual activity: Not on file  Other Topics Concern  . Not on file  Social History Narrative  . Not on file   Family History  Problem Relation Age of Onset  . Hyperlipidemia Mother    Current Outpatient Medications on File Prior to Visit  Medication Sig  . allopurinol (ZYLOPRIM) 100 MG tablet Take by mouth.   No current facility-administered medications on file prior to visit.     Review of Systems  Constitutional: Negative for activity change, appetite change, fatigue and unexpected weight change.  HENT: Negative for congestion, hearing loss and trouble swallowing.   Eyes: Negative  for visual disturbance.  Respiratory: Negative for choking, shortness of breath and wheezing.   Cardiovascular: Negative for chest pain, palpitations and leg swelling.  Gastrointestinal: Negative for abdominal pain, blood in stool, constipation and diarrhea.  Endocrine: Negative for cold intolerance and heat intolerance.  Genitourinary: Negative for difficulty urinating, discharge, flank pain, genital sores, penile pain, penile swelling, scrotal swelling and testicular pain.  Musculoskeletal: Negative for arthralgias, back pain and myalgias.  Skin: Negative for color change, rash and wound.  Allergic/Immunologic: Negative for environmental allergies.  Neurological: Negative for dizziness, seizures, weakness and headaches.  Psychiatric/Behavioral: Negative for behavioral  problems, decreased concentration, dysphoric mood, sleep disturbance and suicidal ideas. The patient is not nervous/anxious.    Per HPI unless specifically indicated above     Objective:    BP 128/70 (BP Location: Right Arm, Patient Position: Sitting, Cuff Size: Normal)   Pulse 60   Temp 98.9 F (37.2 C) (Oral)   Ht 6\' 1"  (1.854 m)   Wt 212 lb 12.8 oz (96.5 kg)   BMI 28.08 kg/m   Wt Readings from Last 3 Encounters:  05/25/18 212 lb 12.8 oz (96.5 kg)  04/22/18 211 lb (95.7 kg)  01/06/18 205 lb (93 kg)    Physical Exam  Constitutional: He is oriented to person, place, and time. He appears well-developed and well-nourished. No distress.  HENT:  Head: Normocephalic and atraumatic.  Right Ear: External ear normal.  Left Ear: External ear normal.  Nose: Nose normal.  Mouth/Throat: Oropharynx is clear and moist.  Eyes: Pupils are equal, round, and reactive to light. Conjunctivae are normal.  Neck: Normal range of motion. Neck supple. No JVD present. No tracheal deviation present. No thyromegaly present.  Cardiovascular: Normal rate, regular rhythm, normal heart sounds and intact distal pulses. Exam reveals no gallop and no friction rub.  No murmur heard. Pulmonary/Chest: Effort normal and breath sounds normal. No respiratory distress.  Abdominal: Soft. Bowel sounds are normal. He exhibits no distension. There is no hepatosplenomegaly. There is no tenderness.  Musculoskeletal: Normal range of motion.  Lymphadenopathy:    He has no cervical adenopathy.  Neurological: He is alert and oriented to person, place, and time. He displays abnormal reflex (Decrease +1 patellar reflexes). No cranial nerve deficit.  Skin: Skin is warm and dry. Capillary refill takes less than 2 seconds.  Multiple nevi, consistent appearance. - Single nevus larger than others with irregular borders approx 3 mm in size.   Psychiatric: He has a normal mood and affect. His behavior is normal. Judgment and thought  content normal.  Nursing note and vitals reviewed.  Results for orders placed or performed in visit on 04/26/18  CBC with Differential  Result Value Ref Range   WBC 7.3 3.4 - 10.8 x10E3/uL   RBC 4.64 4.14 - 5.80 x10E6/uL   Hemoglobin 14.3 13.0 - 17.7 g/dL   Hematocrit 16.1 09.6 - 51.0 %   MCV 88 79 - 97 fL   MCH 30.8 26.6 - 33.0 pg   MCHC 34.9 31.5 - 35.7 g/dL   RDW 04.5 40.9 - 81.1 %   Platelets 212 150 - 450 x10E3/uL   Neutrophils 65 Not Estab. %   Lymphs 22 Not Estab. %   Monocytes 7 Not Estab. %   Eos 4 Not Estab. %   Basos 1 Not Estab. %   Neutrophils Absolute 4.8 1.4 - 7.0 x10E3/uL   Lymphocytes Absolute 1.6 0.7 - 3.1 x10E3/uL   Monocytes Absolute 0.5 0.1 - 0.9 x10E3/uL  EOS (ABSOLUTE) 0.3 0.0 - 0.4 x10E3/uL   Basophils Absolute 0.0 0.0 - 0.2 x10E3/uL   Immature Granulocytes 1 Not Estab. %   Immature Grans (Abs) 0.1 0.0 - 0.1 x10E3/uL  Comprehensive Metabolic Panel (CMET)  Result Value Ref Range   Glucose 127 (H) 65 - 99 mg/dL   BUN 13 6 - 24 mg/dL   Creatinine, Ser 6.210.93 0.76 - 1.27 mg/dL   GFR calc non Af Amer 92 >59 mL/min/1.73   GFR calc Af Amer 106 >59 mL/min/1.73   BUN/Creatinine Ratio 14 9 - 20   Sodium 140 134 - 144 mmol/L   Potassium 4.3 3.5 - 5.2 mmol/L   Chloride 103 96 - 106 mmol/L   CO2 21 20 - 29 mmol/L   Calcium 9.2 8.7 - 10.2 mg/dL   Total Protein 6.7 6.0 - 8.5 g/dL   Albumin 4.6 3.5 - 5.5 g/dL   Globulin, Total 2.1 1.5 - 4.5 g/dL   Albumin/Globulin Ratio 2.2 1.2 - 2.2   Bilirubin Total 0.2 0.0 - 1.2 mg/dL   Alkaline Phosphatase 87 39 - 117 IU/L   AST 23 0 - 40 IU/L   ALT 37 0 - 44 IU/L      Assessment & Plan:   Problem List Items Addressed This Visit    None    Visit Diagnoses    Encounter for annual physical exam    -  Primary   Relevant Orders   Hemoglobin A1c   Lipid panel   COMPLETE METABOLIC PANEL WITH GFR   TSH   PSA   Hepatitis C antibody   HIV antibody   Kidney stone       Relevant Orders   Stone analysis        Physical exam with no new findings.  Well adult with no acute concerns.  Desires to have lipid screening.  Still dealing with passing kidney stones.  Has passed 2 so far.  Requests information about diet changes.  Plan: 1. Obtain health maintenance screenings with labs as above according to age. - Increase physical activity to 30 minutes most days of the week.  - Eat healthy diet high in vegetables and fruits; low in refined carbohydrates.  Provided kidney stone diet info.  Difficult to exclude foods without stone analysis.  Consider taking stone to urologist for study. 2. Return 1 year for annual physical.    Follow up plan: Return in about 1 year (around 05/26/2019) for annual physical.  Wilhelmina McardleLauren Frieda Arnall, DNP, AGPCNP-BC Adult Gerontology Primary Care Nurse Practitioner [facility] 05/25/2018, 2:43 PM

## 2018-05-26 LAB — COMPLETE METABOLIC PANEL WITH GFR
AG Ratio: 2.1 (calc) (ref 1.0–2.5)
ALT: 35 U/L (ref 9–46)
AST: 25 U/L (ref 10–35)
Albumin: 4.9 g/dL (ref 3.6–5.1)
Alkaline phosphatase (APISO): 70 U/L (ref 40–115)
BUN: 10 mg/dL (ref 7–25)
CO2: 29 mmol/L (ref 20–32)
Calcium: 9.8 mg/dL (ref 8.6–10.3)
Chloride: 104 mmol/L (ref 98–110)
Creat: 0.93 mg/dL (ref 0.70–1.33)
GFR, Est African American: 107 mL/min/{1.73_m2} (ref >=60)
GFR, Est Non African American: 92 mL/min/{1.73_m2} (ref >=60)
Globulin: 2.3 g/dL (calc) (ref 1.9–3.7)
Glucose, Bld: 120 mg/dL — ABNORMAL HIGH (ref 65–99)
Potassium: 4.6 mmol/L (ref 3.5–5.3)
Sodium: 139 mmol/L (ref 135–146)
Total Bilirubin: 0.6 mg/dL (ref 0.2–1.2)
Total Protein: 7.2 g/dL (ref 6.1–8.1)

## 2018-05-26 LAB — LIPID PANEL
Cholesterol: 212 mg/dL — ABNORMAL HIGH (ref <200)
HDL: 41 mg/dL (ref >40)
LDL Cholesterol (Calc): 116 mg/dL (calc) — ABNORMAL HIGH
Non-HDL Cholesterol (Calc): 171 mg/dL (calc) — ABNORMAL HIGH (ref <130)
Total CHOL/HDL Ratio: 5.2 (calc) — ABNORMAL HIGH (ref <5.0)
Triglycerides: 382 mg/dL — ABNORMAL HIGH (ref <150)

## 2018-05-26 LAB — HEPATITIS C ANTIBODY
Hepatitis C Ab: NONREACTIVE
SIGNAL TO CUT-OFF: 0.01 (ref <1.00)

## 2018-05-26 LAB — HIV ANTIBODY (ROUTINE TESTING W REFLEX): HIV 1&2 Ab, 4th Generation: NONREACTIVE

## 2018-05-26 LAB — HEMOGLOBIN A1C
Hgb A1c MFr Bld: 5.6 % of total Hgb (ref <5.7)
Mean Plasma Glucose: 114 (calc)
eAG (mmol/L): 6.3 (calc)

## 2018-05-26 LAB — PSA: PSA: 1 ng/mL (ref <=4.0)

## 2018-05-26 LAB — TSH: TSH: 1.17 mIU/L (ref 0.40–4.50)

## 2018-05-31 MED ORDER — ATORVASTATIN CALCIUM 10 MG PO TABS: 10.0000 mg | ORAL_TABLET | Freq: Every day | ORAL | 3 refills | Status: DC

## 2018-05-31 NOTE — Addendum Note (Signed)
Addended by: Wilhelmina McardleKENNEDY, Mamta Rimmer R on: 05/31/2018 01:29 PM   Modules accepted: Orders

## 2018-10-13 ENCOUNTER — Other Ambulatory Visit: Payer: Self-pay | Admitting: Urology

## 2018-10-13 DIAGNOSIS — R109 Unspecified abdominal pain: Secondary | ICD-10-CM

## 2018-10-20 ENCOUNTER — Ambulatory Visit: Payer: Managed Care, Other (non HMO)

## 2018-10-24 ENCOUNTER — Ambulatory Visit
Admission: RE | Admit: 2018-10-24 | Discharge: 2018-10-24 | Disposition: A | Discharge status: Home / Self Care | Payer: Managed Care, Other (non HMO) | Source: Ambulatory Visit | Attending: Urology | Admitting: Urology

## 2018-10-24 DIAGNOSIS — R109 Unspecified abdominal pain: Secondary | ICD-10-CM | POA: Diagnosis not present

## 2018-10-24 MED ORDER — IOPAMIDOL (ISOVUE-300) INJECTION 61%
125.0000 mL | Freq: Once | INTRAVENOUS | Status: AC | PRN
  Administered 2018-10-24: 125 mL via INTRAVENOUS

## 2019-01-31 ENCOUNTER — Encounter: Payer: Self-pay | Admitting: Nurse Practitioner

## 2019-01-31 ENCOUNTER — Ambulatory Visit (INDEPENDENT_AMBULATORY_CARE_PROVIDER_SITE_OTHER): Payer: Managed Care, Other (non HMO) | Admitting: Nurse Practitioner

## 2019-01-31 ENCOUNTER — Other Ambulatory Visit: Payer: Self-pay

## 2019-01-31 DIAGNOSIS — M791 Myalgia, unspecified site: Secondary | ICD-10-CM | POA: Diagnosis not present

## 2019-01-31 DIAGNOSIS — E559 Vitamin D deficiency, unspecified: Secondary | ICD-10-CM

## 2019-01-31 DIAGNOSIS — R768 Other specified abnormal immunological findings in serum: Secondary | ICD-10-CM

## 2019-01-31 MED ORDER — PREDNISONE 10 MG PO TABS: ORAL_TABLET | ORAL | 0 refills | Status: DC

## 2019-01-31 NOTE — Progress Notes (Signed)
Telemedicine Encounter: Disclosed to patient at start of encounter that we will provide appropriate telemedicine services.  Patient consents to be treated via phone prior to discussion. - Patient is at his home and is accessed via telephone. - Services are provided by Cassell Smiles from [facility].  Subjective:    Patient ID: Eric Hogan, male    DOB: [birth year], 58 y.o.   MRN: [id number]  Eric Hogan is a 58 y.o. male presenting on 01/31/2019 for Muscle Pain (bilateral knee, shoulders elbows, and triceps x 2 mths )  HPI  Polymyalgias Patient is having muscle stiffness/soress in back of knees, hips, lower back aching; shoulders, insides of elbows, tricep burning.  Hands, thumb large joint with pain and some swelling.   - Pain is described as aching and STARTED about 2 months ago.  Has been putting off symptoms, is continuing to worsen.  Has hard time to "get going."  Has difficulty standing up straight.  Worsens again at night.  Does not feel joint stiffness.  Muscle soreness/ tendons behind knees/in elbows.  - Patient has taken nothing over the counter for pain. Has swelling reaction to ASA and ibuprofen.  - Patient has autoimmune inflammatory diseases like RA history in family. Also has a sister who was diagnosed with Fibromyalgia.    Social History   Tobacco Use  . Smoking status: Former Smoker    Last attempt to quit: 02/20/2018    Years since quitting: 0.9  . Smokeless tobacco: Never Used  Substance Use Topics  . Alcohol use: Yes    Alcohol/week: 0.0 standard drinks  . Drug use: No   Review of Systems Per HPI unless specifically indicated above    Objective:    There were no vitals taken for this visit.  Wt Readings from Last 3 Encounters:  05/25/18 212 lb 12.8 oz (96.5 kg)  04/22/18 211 lb (95.7 kg)  01/06/18 205 lb (93 kg)    Physical Exam Patient remotely monitored.  Verbal communication appropriate.  Cognition normal.   Results for orders  placed or performed in visit on 05/25/18  Hemoglobin A1c  Result Value Ref Range   Hgb A1c MFr Bld 5.6 <5.7 % of total Hgb   Mean Plasma Glucose 114 (calc)   eAG (mmol/L) 6.3 (calc)  Lipid panel  Result Value Ref Range   Cholesterol 212 (H) <200 mg/dL   HDL 41 >40 mg/dL   Triglycerides 382 (H) <150 mg/dL   LDL Cholesterol (Calc) 116 (H) mg/dL (calc)   Total CHOL/HDL Ratio 5.2 (H) <5.0 (calc)   Non-HDL Cholesterol (Calc) 171 (H) <130 mg/dL (calc)  TSH  Result Value Ref Range   TSH 1.17 0.40 - 4.50 mIU/L  PSA  Result Value Ref Range   PSA 1.0 < OR = 4.0 ng/mL  Hepatitis C antibody  Result Value Ref Range   Hepatitis C Ab NON-REACTIVE NON-REACTI   SIGNAL TO CUT-OFF 0.01 <1.00  HIV antibody  Result Value Ref Range   HIV 1&2 Ab, 4th Generation NON-REACTIVE NON-REACTI  COMPLETE METABOLIC PANEL WITH GFR  Result Value Ref Range   Glucose, Bld 120 (H) 65 - 99 mg/dL   BUN 10 7 - 25 mg/dL   Creat 0.93 0.70 - 1.33 mg/dL   GFR, Est Non African American 92 > OR = 60 mL/min/1.50m   GFR, Est African American 107 > OR = 60 mL/min/1.771m  BUN/Creatinine Ratio NOT APPLICABLE 6 - 22 (calc)   Sodium 139 135 -  146 mmol/L   Potassium 4.6 3.5 - 5.3 mmol/L   Chloride 104 98 - 110 mmol/L   CO2 29 20 - 32 mmol/L   Calcium 9.8 8.6 - 10.3 mg/dL   Total Protein 7.2 6.1 - 8.1 g/dL   Albumin 4.9 3.6 - 5.1 g/dL   Globulin 2.3 1.9 - 3.7 g/dL (calc)   AG Ratio 2.1 1.0 - 2.5 (calc)   Total Bilirubin 0.6 0.2 - 1.2 mg/dL   Alkaline phosphatase (APISO) 70 40 - 115 U/L   AST 25 10 - 35 U/L   ALT 35 9 - 46 U/L      Assessment & Plan:   Problem List Items Addressed This Visit    None    Visit Diagnoses    Myalgia    -  Primary   Relevant Medications   predniSONE (DELTASONE) 10 MG tablet   Other Relevant Orders   CRP High sensitivity (Completed)   Sed Rate (ESR) (Completed)   CBC with Differential/Platelet (Completed)   COMPLETE METABOLIC PANEL WITH GFR (Completed)   Magnesium (Completed)    VITAMIN D 25 Hydroxy (Vit-D Deficiency, Fractures) (Completed)   Rheumatoid Factor (Completed)   Antinuclear Antib (ANA)   Vitamin D deficiency       Relevant Medications   ergocalciferol (VITAMIN D2) 1.25 MG (50000 UT) capsule    Patient with polymyalgias.  Presentation possibly consistent with polymyalgia rheumatica.  Cannot fully exclude electrolyte abnormalities or fibromyalgia.  Plan: 1. Labs today 2. Take prednisone taper 10 mg tablets Day 1 (Today): Take 6 pills at one time Day 2: Take 6 pills  Day 3: Take 5 pills Day 4: Take 4 pills Day 5: Take 3 pills Day 6: Take 2 pills Day 7: Take 1 pill then stop. 3. Consider future referral to rheumatology if needed. 4. Follow-up 2-4 weeks if not improved after steroid.   Labs also revealed Vitamin D deficiency - replace weekly with ergocalciferol 50,000 IU every 7 days then transition to 2,000 IU daily OTC Vit D3.  Meds ordered this encounter  Medications  . predniSONE (DELTASONE) 10 MG tablet    Sig: Day 1-2 take 6 pills. Day 3 take 5 pills then reduce by 1 pill each day.    Dispense:  27 tablet    Refill:  0    Order Specific Question:   Supervising Provider    Answer:   Olin Hauser [[id number]]  . ergocalciferol (VITAMIN D2) 1.25 MG (50000 UT) capsule    Sig: Take 1 capsule (50,000 Units total) by mouth once a week for 8 doses.    Dispense:  8 capsule    Refill:  0    Order Specific Question:   Supervising Provider    Answer:   Olin Hauser [[id number]]   - Time spent in direct consultation with patient via telemedicine about above concerns: 13 minutes  Follow up plan: 1-2 weeks prn if no improvement.  Cassell Smiles, DNP, AGPCNP-BC Adult Gerontology Primary Care Nurse Practitioner [facility] 01/31/2019, 8:58 AM

## 2019-02-01 ENCOUNTER — Encounter: Payer: Self-pay | Admitting: Nurse Practitioner

## 2019-02-01 LAB — HIGH SENSITIVITY CRP: hs-CRP: >10 mg/L — ABNORMAL HIGH

## 2019-02-01 LAB — COMPLETE METABOLIC PANEL WITH GFR
AG Ratio: 1.9 (calc) (ref 1.0–2.5)
ALT: 25 U/L (ref 9–46)
AST: 19 U/L (ref 10–35)
Albumin: 4.7 g/dL (ref 3.6–5.1)
Alkaline phosphatase (APISO): 81 U/L (ref 35–144)
BUN: 16 mg/dL (ref 7–25)
CO2: 27 mmol/L (ref 20–32)
Calcium: 9.6 mg/dL (ref 8.6–10.3)
Chloride: 103 mmol/L (ref 98–110)
Creat: 0.87 mg/dL (ref 0.70–1.33)
GFR, Est African American: 113 mL/min/{1.73_m2} (ref >=60)
GFR, Est Non African American: 97 mL/min/{1.73_m2} (ref >=60)
Globulin: 2.5 g/dL (calc) (ref 1.9–3.7)
Glucose, Bld: 111 mg/dL (ref 65–139)
Potassium: 4.6 mmol/L (ref 3.5–5.3)
Sodium: 138 mmol/L (ref 135–146)
Total Bilirubin: 0.6 mg/dL (ref 0.2–1.2)
Total Protein: 7.2 g/dL (ref 6.1–8.1)

## 2019-02-01 LAB — CBC WITH DIFFERENTIAL/PLATELET
Absolute Monocytes: 509 cells/uL (ref 200–950)
Basophils Absolute: 60 cells/uL (ref 0–200)
Basophils Relative: 0.9 %
Eosinophils Absolute: 221 cells/uL (ref 15–500)
Eosinophils Relative: 3.3 %
HCT: 41.3 % (ref 38.5–50.0)
Hemoglobin: 14.5 g/dL (ref 13.2–17.1)
Lymphs Abs: 1729 cells/uL (ref 850–3900)
MCH: 30.7 pg (ref 27.0–33.0)
MCHC: 35.1 g/dL (ref 32.0–36.0)
MCV: 87.5 fL (ref 80.0–100.0)
MPV: 12.1 fL (ref 7.5–12.5)
Monocytes Relative: 7.6 %
Neutro Abs: 4181 cells/uL (ref 1500–7800)
Neutrophils Relative %: 62.4 %
Platelets: 251 10*3/uL (ref 140–400)
RBC: 4.72 10*6/uL (ref 4.20–5.80)
RDW: 12.4 % (ref 11.0–15.0)
Total Lymphocyte: 25.8 %
WBC: 6.7 10*3/uL (ref 3.8–10.8)

## 2019-02-01 LAB — VITAMIN D 25 HYDROXY (VIT D DEFICIENCY, FRACTURES): Vit D, 25-Hydroxy: 17 ng/mL — ABNORMAL LOW (ref 30–100)

## 2019-02-01 LAB — ANTI-NUCLEAR AB-TITER (ANA TITER): ANA Titer 1: 1:40 {titer} — ABNORMAL HIGH

## 2019-02-01 LAB — MAGNESIUM: Magnesium: 2.2 mg/dL (ref 1.5–2.5)

## 2019-02-01 LAB — ANA: Anti Nuclear Antibody (ANA): POSITIVE — AB

## 2019-02-01 LAB — RHEUMATOID FACTOR: Rheumatoid fact SerPl-aCnc: <14 IU/mL (ref <14)

## 2019-02-01 LAB — SEDIMENTATION RATE: Sed Rate: 22 mm/h — ABNORMAL HIGH (ref 0–20)

## 2019-02-01 MED ORDER — ERGOCALCIFEROL 1.25 MG (50000 UT) PO CAPS: 50000.0000 [IU] | ORAL_CAPSULE | ORAL | 0 refills | Status: AC

## 2019-02-02 NOTE — Addendum Note (Signed)
Addended by: Wilhelmina Mcardle R on: 02/02/2019 11:33 AM   Modules accepted: Orders

## 2019-02-10 ENCOUNTER — Telehealth: Payer: Self-pay | Admitting: Nurse Practitioner

## 2019-02-10 DIAGNOSIS — M791 Myalgia, unspecified site: Secondary | ICD-10-CM

## 2019-02-10 DIAGNOSIS — R768 Other specified abnormal immunological findings in serum: Secondary | ICD-10-CM

## 2019-02-10 MED ORDER — PREDNISONE 5 MG PO TABS: 5.0000 mg | ORAL_TABLET | Freq: Every day | ORAL | 0 refills | Status: DC

## 2019-02-10 NOTE — Telephone Encounter (Signed)
PT  Called said that his appt for rheumatologist is in August, pt is requesting refill on prednisone

## 2019-02-10 NOTE — Telephone Encounter (Signed)
Prednisone helped patient's pain symptoms.  Patient reports he felt limber, able to bend and stand without pain.  Lower back pain disappeared.    After 1 week, is starting to notice slow increase in stiffness.  Due to positive ANA with polymyalgias, will continue low dose prednisone for additional 1 month.  Instructed patient to take prednisone 5 mg daily for 14 days.  Then, can take every other day as long as symptoms are controlled.  Discussed need to use long-term prednisone cautiously to prevent adrenal insufficiency. #30 tabs for one month fill. No refills provided, but may be indicated in future.

## 2019-02-20 ENCOUNTER — Other Ambulatory Visit: Payer: Self-pay | Admitting: Nurse Practitioner

## 2019-02-20 DIAGNOSIS — R768 Other specified abnormal immunological findings in serum: Secondary | ICD-10-CM

## 2019-02-20 DIAGNOSIS — M791 Myalgia, unspecified site: Secondary | ICD-10-CM

## 2019-02-20 NOTE — Telephone Encounter (Signed)
Covering inbox for Wilhelmina Mcardle, AGPCNP-BC while she is out of office on maternity leave.  Patient was treated by Leotis Shames at virtual visit on 01/31/19 with prednisone 1 week taper, and had positive rheumatology labs.  Patient requested prednisone again on 02/10/19 after symptoms returned off med.  She already rx Prednisone 5mg  - once daily #30 pills - to take every day for 2 weeks, then every other day.  It has only been 10 days since that rx was ordered.  Either he is taking it too much and ran out too fast. Or he never started it.  Could you call to find out more information?  If he has too many questions or there is a problem with the medicine, he should schedule a Virtual Visit with me by phone.  [name], DO [facility] 02/20/2019, 4:30 PM

## 2019-02-20 NOTE — Telephone Encounter (Signed)
Pt called requesting refill on prednisone. Pt  Call back  # [phone]

## 2019-02-21 MED ORDER — PREDNISONE 10 MG PO TABS: 10.0000 mg | ORAL_TABLET | Freq: Every day | ORAL | 0 refills | Status: DC

## 2019-02-21 NOTE — Telephone Encounter (Signed)
Patient called back, was taking 5mg  x 3 per day for 15mg  daily due to other dose ineffective, he will run out of meds early, he was not taking as advised, and declined virtual visit.  I offered prednisone 10mg , once daily to be taken for up to 1 month, he needs to schedule follow-up virtual visit before he runs out within 3-4 weeks.  His rheum apt is not until August 2020.  Rx sent [facility]  [name], DO [facility] 02/21/2019, 8:34 AM

## 2019-03-26 ENCOUNTER — Other Ambulatory Visit: Payer: Self-pay | Admitting: Nurse Practitioner

## 2019-03-26 DIAGNOSIS — E559 Vitamin D deficiency, unspecified: Secondary | ICD-10-CM

## 2019-03-27 ENCOUNTER — Other Ambulatory Visit: Payer: Self-pay | Admitting: Nurse Practitioner

## 2019-03-27 DIAGNOSIS — M791 Myalgia, unspecified site: Secondary | ICD-10-CM

## 2019-03-27 DIAGNOSIS — R768 Other specified abnormal immunological findings in serum: Secondary | ICD-10-CM

## 2019-03-27 MED ORDER — PREDNISONE 10 MG PO TABS: 10.0000 mg | ORAL_TABLET | Freq: Every day | ORAL | 0 refills | Status: DC

## 2019-03-27 NOTE — Telephone Encounter (Signed)
The pt was notified. No questions or concerns. 

## 2019-03-27 NOTE — Telephone Encounter (Signed)
Pt  called said that he did not have appt until June 12th,Pt called requesting refill on prednisone called into [facility]

## 2019-03-27 NOTE — Telephone Encounter (Signed)
Patient was extended on his Prednisone course about 1 month ago, now he ran out, he should continue Prednisone 10mg  daily until he sees Rheumatology on 6/12.  I sent new rx Prednisone 10mg  daily for 30 pills with no refills.  After this one, no further rx Prednisone from our office anticipated. He will need to discuss prednisone dosing and taper or other medication with specialist on 04/07/19.  [name], DO [facility] 03/27/2019, 1:06 PM

## 2019-08-25 ENCOUNTER — Other Ambulatory Visit: Payer: Self-pay | Admitting: Nurse Practitioner

## 2019-08-25 DIAGNOSIS — E782 Mixed hyperlipidemia: Secondary | ICD-10-CM

## 2019-11-21 ENCOUNTER — Other Ambulatory Visit: Payer: Self-pay | Admitting: Family Medicine

## 2019-11-21 DIAGNOSIS — E782 Mixed hyperlipidemia: Secondary | ICD-10-CM

## 2020-01-03 ENCOUNTER — Other Ambulatory Visit
Admission: RE | Admit: 2020-01-03 | Discharge: 2020-01-03 | Disposition: A | Discharge status: Home / Self Care | Payer: Managed Care, Other (non HMO) | Source: Ambulatory Visit | Attending: Urology | Admitting: Urology

## 2020-01-03 ENCOUNTER — Other Ambulatory Visit: Payer: Self-pay

## 2020-01-03 DIAGNOSIS — Z01812 Encounter for preprocedural laboratory examination: Secondary | ICD-10-CM | POA: Diagnosis not present

## 2020-01-03 DIAGNOSIS — Z20822 Contact with and (suspected) exposure to covid-19: Secondary | ICD-10-CM | POA: Diagnosis not present

## 2020-01-03 LAB — SARS CORONAVIRUS 2 (TAT 6-24 HRS): SARS Coronavirus 2: NEGATIVE

## 2020-01-04 ENCOUNTER — Other Ambulatory Visit: Payer: Self-pay

## 2020-01-04 ENCOUNTER — Ambulatory Visit: Payer: Managed Care, Other (non HMO) | Admitting: Registered Nurse

## 2020-01-04 ENCOUNTER — Encounter
Admission: RE | Disposition: A | Discharge status: Home / Self Care | Payer: Self-pay | Source: Home / Self Care | Attending: Urology

## 2020-01-04 ENCOUNTER — Ambulatory Visit
Admission: RE | Admit: 2020-01-04 | Discharge: 2020-01-04 | Disposition: A | Discharge status: Home / Self Care | Payer: Managed Care, Other (non HMO) | Attending: Urology | Admitting: Urology

## 2020-01-04 ENCOUNTER — Ambulatory Visit: Payer: Managed Care, Other (non HMO)

## 2020-01-04 ENCOUNTER — Encounter: Payer: Self-pay | Admitting: Urology

## 2020-01-04 DIAGNOSIS — N201 Calculus of ureter: Secondary | ICD-10-CM

## 2020-01-04 DIAGNOSIS — Z87891 Personal history of nicotine dependence: Secondary | ICD-10-CM | POA: Insufficient documentation

## 2020-01-04 DIAGNOSIS — M353 Polymyalgia rheumatica: Secondary | ICD-10-CM | POA: Diagnosis not present

## 2020-01-04 DIAGNOSIS — N132 Hydronephrosis with renal and ureteral calculous obstruction: Secondary | ICD-10-CM | POA: Diagnosis not present

## 2020-01-04 HISTORY — PX: CYSTOSCOPY/RETROGRADE/URETEROSCOPY: SHX5316

## 2020-01-04 HISTORY — PX: CYSTOSCOPY W/ URETERAL STENT PLACEMENT: SHX1429

## 2020-01-04 SURGERY — CYSTOSCOPY/RETROGRADE/URETEROSCOPY
Anesthesia: General | Site: Ureter | Laterality: Left

## 2020-01-04 MED ORDER — DEXAMETHASONE SODIUM PHOSPHATE 4 MG/ML IJ SOLN
INTRAMUSCULAR | Status: AC
  Filled 2020-01-04: qty 1

## 2020-01-04 MED ORDER — LIDOCAINE HCL URETHRAL/MUCOSAL 2 % EX GEL
CUTANEOUS | Status: DC | PRN
  Administered 2020-01-04: 1 via URETHRAL

## 2020-01-04 MED ORDER — LACTATED RINGERS IV SOLN: INTRAVENOUS | Status: DC

## 2020-01-04 MED ORDER — ONDANSETRON HCL 4 MG/2ML IJ SOLN
INTRAMUSCULAR | Status: DC | PRN
  Administered 2020-01-04: 4 mg via INTRAVENOUS

## 2020-01-04 MED ORDER — CEFAZOLIN SODIUM-DEXTROSE 1-4 GM/50ML-% IV SOLN
1.0000 g | Freq: Once | INTRAVENOUS | Status: AC
  Administered 2020-01-04: 15:00:00 1 g via INTRAVENOUS

## 2020-01-04 MED ORDER — MIDAZOLAM HCL 2 MG/2ML IJ SOLN
INTRAMUSCULAR | Status: DC | PRN
  Administered 2020-01-04: 2 mg via INTRAVENOUS

## 2020-01-04 MED ORDER — CEFAZOLIN SODIUM-DEXTROSE 1-4 GM/50ML-% IV SOLN
INTRAVENOUS | Status: AC
  Filled 2020-01-04: qty 50

## 2020-01-04 MED ORDER — SODIUM CHLORIDE FLUSH 0.9 % IV SOLN
INTRAVENOUS | Status: AC
  Filled 2020-01-04: qty 10

## 2020-01-04 MED ORDER — ACETAMINOPHEN 10 MG/ML IV SOLN
INTRAVENOUS | Status: DC | PRN
  Administered 2020-01-04: 1000 mg via INTRAVENOUS

## 2020-01-04 MED ORDER — FAMOTIDINE 20 MG PO TABS
ORAL_TABLET | ORAL | Status: AC
  Administered 2020-01-04: 12:00:00 20 mg via ORAL
  Filled 2020-01-04: qty 1

## 2020-01-04 MED ORDER — ROCURONIUM BROMIDE 100 MG/10ML IV SOLN
INTRAVENOUS | Status: DC | PRN
  Administered 2020-01-04: 60 mg via INTRAVENOUS

## 2020-01-04 MED ORDER — ROCURONIUM BROMIDE 10 MG/ML (PF) SYRINGE
PREFILLED_SYRINGE | INTRAVENOUS | Status: AC
  Filled 2020-01-04: qty 10

## 2020-01-04 MED ORDER — SUGAMMADEX SODIUM 200 MG/2ML IV SOLN
INTRAVENOUS | Status: DC | PRN
  Administered 2020-01-04: 200 mg via INTRAVENOUS

## 2020-01-04 MED ORDER — ACETAMINOPHEN 10 MG/ML IV SOLN
INTRAVENOUS | Status: AC
  Filled 2020-01-04: qty 100

## 2020-01-04 MED ORDER — IOTHALAMATE MEGLUMINE 43 % IV SOLN
INTRAVENOUS | Status: DC | PRN
  Administered 2020-01-04: 20 mL

## 2020-01-04 MED ORDER — FAMOTIDINE 20 MG PO TABS: 20.0000 mg | ORAL_TABLET | Freq: Once | ORAL | Status: AC

## 2020-01-04 MED ORDER — FLEET ENEMA 7-19 GM/118ML RE ENEM
1.0000 | ENEMA | Freq: Once | RECTAL | Status: AC
  Administered 2020-01-04: 13:00:00 1 via RECTAL

## 2020-01-04 MED ORDER — FENTANYL CITRATE (PF) 100 MCG/2ML IJ SOLN: 25.0000 ug | INTRAMUSCULAR | Status: DC | PRN

## 2020-01-04 MED ORDER — LIDOCAINE HCL (CARDIAC) PF 100 MG/5ML IV SOSY
PREFILLED_SYRINGE | INTRAVENOUS | Status: DC | PRN
  Administered 2020-01-04: 100 mg via INTRAVENOUS

## 2020-01-04 MED ORDER — BELLADONNA ALKALOIDS-OPIUM 16.2-60 MG RE SUPP
RECTAL | Status: AC
  Filled 2020-01-04: qty 1

## 2020-01-04 MED ORDER — LIDOCAINE HCL URETHRAL/MUCOSAL 2 % EX GEL
CUTANEOUS | Status: AC
  Filled 2020-01-04: qty 10

## 2020-01-04 MED ORDER — FENTANYL CITRATE (PF) 100 MCG/2ML IJ SOLN
INTRAMUSCULAR | Status: AC
  Filled 2020-01-04: qty 2

## 2020-01-04 MED ORDER — PROPOFOL 10 MG/ML IV BOLUS
INTRAVENOUS | Status: AC
  Filled 2020-01-04: qty 20

## 2020-01-04 MED ORDER — BELLADONNA ALKALOIDS-OPIUM 16.2-60 MG RE SUPP
RECTAL | Status: DC | PRN
  Administered 2020-01-04: 1 via RECTAL

## 2020-01-04 MED ORDER — FENTANYL CITRATE (PF) 100 MCG/2ML IJ SOLN
INTRAMUSCULAR | Status: DC | PRN
  Administered 2020-01-04: 50 ug via INTRAVENOUS

## 2020-01-04 MED ORDER — DOCUSATE SODIUM 100 MG PO CAPS: 200.0000 mg | ORAL_CAPSULE | Freq: Two times a day (BID) | ORAL | 3 refills | Status: DC

## 2020-01-04 MED ORDER — ONDANSETRON HCL 4 MG/2ML IJ SOLN
INTRAMUSCULAR | Status: AC
  Filled 2020-01-04: qty 2

## 2020-01-04 MED ORDER — MIDAZOLAM HCL 2 MG/2ML IJ SOLN
INTRAMUSCULAR | Status: AC
  Filled 2020-01-04: qty 2

## 2020-01-04 MED ORDER — ONDANSETRON HCL 4 MG/2ML IJ SOLN: 4.0000 mg | Freq: Once | INTRAMUSCULAR | Status: DC | PRN

## 2020-01-04 MED ORDER — DEXAMETHASONE SODIUM PHOSPHATE 10 MG/ML IJ SOLN
INTRAMUSCULAR | Status: DC | PRN
  Administered 2020-01-04: 8 mg via INTRAVENOUS

## 2020-01-04 MED ORDER — CIPROFLOXACIN HCL 500 MG PO TABS: 500.0000 mg | ORAL_TABLET | Freq: Two times a day (BID) | ORAL | 0 refills | Status: DC

## 2020-01-04 MED ORDER — PROPOFOL 10 MG/ML IV BOLUS
INTRAVENOUS | Status: DC | PRN
  Administered 2020-01-04: 180 mg via INTRAVENOUS

## 2020-01-04 SURGICAL SUPPLY — 22 items
BAG DRAIN CYSTO-URO LG1000N (MISCELLANEOUS) ×4 IMPLANT
BRUSH SCRUB EZ 1% IODOPHOR (MISCELLANEOUS) ×4 IMPLANT
CATH URETL 5X70 OPEN END (CATHETERS) ×4 IMPLANT
CONRAY 43 FOR UROLOGY 50M (MISCELLANEOUS) ×4 IMPLANT
COVER WAND RF STERILE (DRAPES) ×4 IMPLANT
GLOVE BIO SURGEON STRL SZ7.5 (GLOVE) ×4 IMPLANT
GOWN STRL REUS W/ TWL LRG LVL3 (GOWN DISPOSABLE) ×2 IMPLANT
GOWN STRL REUS W/ TWL LRG LVL4 (GOWN DISPOSABLE) ×2 IMPLANT
GOWN STRL REUS W/ TWL XL LVL3 (GOWN DISPOSABLE) ×2 IMPLANT
GOWN STRL REUS W/TWL LRG LVL3 (GOWN DISPOSABLE) ×2
GOWN STRL REUS W/TWL LRG LVL4 (GOWN DISPOSABLE) ×2
GOWN STRL REUS W/TWL XL LVL3 (GOWN DISPOSABLE) ×2
GUIDEWIRE STR ZIPWIRE 035X150 (MISCELLANEOUS) ×4 IMPLANT
KIT TURNOVER CYSTO (KITS) ×4 IMPLANT
PACK CYSTO AR (MISCELLANEOUS) ×4 IMPLANT
SET CYSTO W/LG BORE CLAMP LF (SET/KITS/TRAYS/PACK) ×4 IMPLANT
SOL .9 NS 3000ML IRR  AL (IV SOLUTION) ×2
SOL .9 NS 3000ML IRR UROMATIC (IV SOLUTION) ×2 IMPLANT
STENT URET 6FRX26 CONTOUR (STENTS) ×3 IMPLANT
SURGILUBE 2OZ TUBE FLIPTOP (MISCELLANEOUS) ×4 IMPLANT
SYR 10ML LL (SYRINGE) ×4 IMPLANT
WATER STERILE IRR 1000ML POUR (IV SOLUTION) ×4 IMPLANT

## 2020-01-04 NOTE — H&P (Signed)
NAME: Eric Hogan, Eric Hogan MEDICAL RECORD [id number] ACCOUNT [id number] DATE OF BIRTH:June 21, 1961 FACILITY: [facility] LOCATION: [facility] PHYSICIAN:Milan Clare Gilles Chiquito, MD  HISTORY AND PHYSICAL  DATE OF ADMISSION:  01/04/2020  CHIEF COMPLAINT:  Left flank pain.  HISTORY OF PRESENT ILLNESS:  The patient is a 59 year old Caucasian male with sudden onset of left flank pain associated with nausea on March 9th.  He had a CT scan which indicated the presence of a 10 mm left UPJ stone with mild hydronephrosis.  He  comes in now for cystoscopy with left retrograde and ureteroscopic ureterolithotomy with holmium laser and stent placement.  PAST MEDICAL HISTORY:  ALLERGIES:  ASPIRIN.  CURRENT MEDICATIONS:  Included fiber supplement, fish oil, Nucynta, and Zofran.  PAST SURGICAL HISTORY: 1.  Sinus surgery x3 from 1998 to 2015. 2.  Appendectomy in 1984. 3.  Vasectomy in 1990.  SOCIAL HISTORY:  The patient quit smoking in 2010 with a 5-pack-year history.  He consumes 12 alcoholic beverages per week.  FAMILY HISTORY:  Father died of heart disease, age 40.  Mother is living, age 61 with heart disease and hypertension.  PAST AND CURRENT MEDICAL CONDITIONS:  1.  Polymyalgia rheumatica. 2.  History of interstitial cystitis. 3.  Gout. 4.  Chronic sinusitis. 5.  Questionable history of cardiac disease. 6.  History of hypercholesterolemia.  REVIEW OF SYSTEMS:  The patient has chronic joint pain.  Denies shortness of breath, diabetes, stroke, or myocardial infarction.  PHYSICAL EXAMINATION: VITAL SIGNS:  Height 6 feet 0 inches, weight 213 pounds, BMI 29. GENERAL:  A well-nourished, white male in no acute distress. HEENT:  Sclerae were clear.  Pupils were equally round, reactive to light and accommodation. NECK:  No palpable thyroid masses.  No audible carotid bruits. LYMPHATIC:  No palpable inguinal or cervical adenopathy. PULMONARY:  Lungs clear to auscultation. CARDIOVASCULAR:  Regular rhythm  and rate. ABDOMEN:  Soft, nontender abdomen.  No CVA tenderness. GENITOURINARY:  Circumcised.  Testes were smooth, nontender, approximately 25 mL in size each. RECTAL:  Deferred. NEUROMUSCULAR:  Alert and oriented x3.  IMPRESSION: 1.  Left ureteropelvic junction calculus with mild hydronephrosis. 2.  Renal colic.  PLAN:  Cystoscopy with left retrograde, left ureteroscopic ureterolithotomy with holmium laser lithotripsy and stent placement.  CN/NUANCE  D:01/03/2020 T:01/03/2020 JOB:[id number]

## 2020-01-04 NOTE — Anesthesia Procedure Notes (Signed)
Procedure Name: Intubation Date/Time: 01/04/2020 2:56 PM Performed by: Lynden Oxford, CRNA Pre-anesthesia Checklist: Patient identified, Emergency Drugs available, Suction available and Patient being monitored Patient Re-evaluated:Patient Re-evaluated prior to induction Oxygen Delivery Method: Circle system utilized Preoxygenation: Pre-oxygenation with 100% oxygen Induction Type: IV induction Ventilation: Mask ventilation without difficulty Laryngoscope Size: McGraph and 4 Grade View: Grade I Tube type: Oral Tube size: 7.5 mm Number of attempts: 1 Airway Equipment and Method: Stylet and Oral airway Placement Confirmation: ETT inserted through vocal cords under direct vision,  positive ETCO2 and breath sounds checked- equal and bilateral Secured at: 22 cm Tube secured with: Tape Dental Injury: Teeth and Oropharynx as per pre-operative assessment

## 2020-01-04 NOTE — Op Note (Signed)
Preoperative diagnosis: 1. Left ureterolithiasis                                             2.  Left hydronephrosis  Postoperative diagnosis: Same  Procedure: 1.  Left ureteroscopy                      2.  Left retrograde pyelogram with fluoroscopy                      3.  Left double-pigtail ureteral stent placement  Surgeon: Suszanne Conners. Evelene Croon MD  Anesthesia: General  Indications:See the history and physical. After informed consent the above procedure(s) were requested     Technique and findings: After adequate general anesthesia had been obtained the patient was placed into dorsal lithotomy position and the perineum was prepped and draped in the usual fashion.  The 21 French cystoscope was coupled to the camera and visually advanced into the bladder.  Patient had numerous small (less than 0.5 mm each) yellow calculi present within the bladder.  An attempt was made to cannulate the left orifice with an 5 [place] open-ended ureteral catheter but was unsuccessful due to angulation.  An attempt was then made to cannulate the orifice with a 0.035 guidewire but this was also unsuccessful due to angulation.  Therefore the cystoscope was removed and the short mini semirigid ureteroscope was advanced to the orifice and passed into the distal ureter.  Retrograde pyelography was then performed through the ureteroscope.  No hydronephrosis was identified but there were several small fragments present within the course of the ureter.  A 0.035 guidewire was then advanced through the ureteroscope fluoroscopic guidance into the left renal pelvis.  The ureteroscope was then removed and then passed alongside the guidewire.  Several less than 0.5 mm stone fragments were identified along the course of the ureter.  Ureteroscope was passed to the UPJ and no large fragments were identified.  The ureteroscope was then removed and care leave the guidewire in position.  The cystoscope was backloaded over the guidewire and a 6  x 26 cm double-pigtail stent advanced over the guidewire.  Guidewire was then removed taking care leave the stent in position.  Retrieval suture was left attached to the stent.  The bladder was drained and cystoscope was removed.  10 cc of viscous Xylocaine was instilled within the urethra.  A B&O suppository was placed.  The procedure was then terminated and patient transferred to the recovery room in stable condition.

## 2020-01-04 NOTE — H&P (Signed)
Date of Initial H&P: 01/03/20  History reviewed, patient examined, no change in status, stable for surgery.

## 2020-01-04 NOTE — Transfer of Care (Signed)
Immediate Anesthesia Transfer of Care Note  Patient: Eric Hogan  Procedure(s) Performed: RETROGRADE/URETEROSCOPY (Left Ureter) CYSTOSCOPY WITH STENT REPLACEMENT (Left Ureter)  Patient Location: PACU  Anesthesia Type:General  Level of Consciousness: awake and oriented  Airway & Oxygen Therapy: Patient Spontanous Breathing and Patient connected to face mask oxygen  Post-op Assessment: Report given to RN and Post -op Vital signs reviewed and stable  Post vital signs: Reviewed and stable  Last Vitals:  Vitals Value Taken Time  BP 146/91 01/04/20 1547  Temp 36.9 C 01/04/20 1547  Pulse 105 01/04/20 1549  Resp 16 01/04/20 1549  SpO2 96 % 01/04/20 1549  Vitals shown include unvalidated device data.  Last Pain:  Vitals:   01/04/20 1547  TempSrc:   PainSc: (P) 0-No pain         Complications: No apparent anesthesia complications

## 2020-01-04 NOTE — Discharge Instructions (Addendum)
Low-Purine Eating Plan A low-purine eating plan involves making food choices to limit your intake of purine. Purine is a kind of uric acid. Too much uric acid in your blood can cause certain conditions, such as gout and kidney stones. Eating a low-purine diet can help control these conditions. What are tips for following this plan? Reading food labels   Avoid foods with saturated or Trans fat.  Check the ingredient list of grains-based foods, such as bread and cereal, to make sure that they contain whole grains.  Check the ingredient list of sauces or soups to make sure they do not contain meat or fish.  When choosing soft drinks, check the ingredient list to make sure they do not contain high-fructose corn syrup. Shopping  Buy plenty of fresh fruits and vegetables.  Avoid buying canned or fresh fish.  Buy dairy products labeled as low-fat or nonfat.  Avoid buying premade or processed foods. These foods are often high in fat, salt (sodium), and added sugar. Cooking  Use olive oil instead of butter when cooking. Oils like olive oil, canola oil, and sunflower oil contain healthy fats. Meal planning  Learn which foods do or do not affect you. If you find out that a food tends to cause your gout symptoms to flare up, avoid eating that food. You can enjoy foods that do not cause problems. If you have any questions about a food item, talk with your dietitian or health care provider.  Limit foods high in fat, especially saturated fat. Fat makes it harder for your body to get rid of uric acid.  Choose foods that are lower in fat and are lean sources of protein. General guidelines  Limit alcohol intake to no more than 1 drink a day for nonpregnant women and 2 drinks a day for men. One drink equals 12 oz of beer, 5 oz of wine, or 1 oz of hard liquor. Alcohol can affect the way your body gets rid of uric acid.  Drink plenty of water to keep your urine clear or pale yellow. Fluids can help  remove uric acid from your body.  If directed by your health care provider, take a vitamin C supplement.  Work with your health care provider and dietitian to develop a plan to achieve or maintain a healthy weight. Losing weight can help reduce uric acid in your blood. What foods are recommended? The items listed may not be a complete list. Talk with your dietitian about what dietary choices are best for you. Foods low in purines Foods low in purines do not need to be limited. These include:  All fruits.  All low-purine vegetables, pickles, and olives.  Breads, pasta, rice, cornbread, and popcorn. Cake and other baked goods.  All dairy foods.  Eggs, nuts, and nut butters.  Spices and condiments, such as salt, herbs, and vinegar.  Plant oils, butter, and margarine.  Water, sugar-free soft drinks, tea, coffee, and cocoa.  Vegetable-based soups, broths, sauces, and gravies. Foods moderate in purines Foods moderate in purines should be limited to the amounts listed.   cup of asparagus, cauliflower, spinach, mushrooms, or green peas, each day.  2/3 cup uncooked oatmeal, each day.   cup dry wheat bran or wheat germ, each day.  2-3 ounces of meat or poultry, each day.  4-6 ounces of shellfish, such as crab, lobster, oysters, or shrimp, each day.  1 cup cooked beans, peas, or lentils, each day.  Soup, broths, or bouillon made from meat or   fish. Limit these foods as much as possible. What foods are not recommended? The items listed may not be a complete list. Talk with your dietitian about what dietary choices are best for you. Limit your intake of foods high in purines, including:  Beer and other alcohol.  Meat-based gravy or sauce.  Canned or fresh fish, such as: ? Anchovies, sardines, herring, and tuna. ? Mussels and scallops. ? Codfish, trout, and haddock.  Eric Hogan.  Organ meats, such as: ? Liver or kidney. ? Tripe. ? Sweetbreads (thymus gland or  pancreas).  [employer] bi[employer]ist.  Yeast or yeast extract supplements.  Drinks sweetened with high-fructose corn syrup. Summary  Eating a low-purine diet can help control conditions caused by too much uric acid in the body, such as gout or kidney stones.  Choose low-purine foods, limit alcohol, and limit foods high in fat.  You will learn over time which foods do or do not affect you. If you find out that a food tends to cause your gout symptoms to flare up, avoid eating that food. This information is not intended to replace advice given to you by your health care provider. Make sure you discuss any questions you have with your health care provider. Document Revised: 09/24/2017 Document Reviewed: 11/25/2016 Elsevier Patient Education  [name]. Ureteral Stent Implantation, Care After This sheet gives you information about how to care for yourself after your procedure. Your health care provider may also give you more specific instructions. If you have problems or questions, contact your health care provider. What can I expect after the procedure? After the procedure, it is common to have:  Nausea.  Mild pain when you urinate. You may feel this pain in your lower back or lower abdomen. The pain should stop within a few minutes after you urinate. This may last for up to 1 week.  A small amount of blood in your urine for several days. Follow these instructions at home: Medicines  Take over-the-counter and prescription medicines only as told by your health care provider.  If you were prescribed an antibiotic medicine, take it as told by your health care provider. Do not stop taking the antibiotic even if you start to feel better.  Do not drive for 24 hours if you were given a sedative during your procedure.  Ask your health care provider if the medicine prescribed to you requires you to avoid driving or using heavy machinery. Activity  Rest as told by your health care  provider.  Avoid sitting for a long time without moving. Get up to take short walks every 1-2 hours. This is important to improve blood flow and breathing. Ask for help if you feel weak or unsteady.  Return to your normal activities as told by your health care provider. Ask your health care provider what activities are safe for you. General instructions   Watch for any blood in your urine. Call your health care provider if the amount of blood in your urine increases.  If you have a catheter: ? Follow instructions from your health care provider about taking care of your catheter and collection bag. ? Do not take baths, swim, or use a hot tub until your health care provider approves. Ask your health care provider if you may take showers. You may only be allowed to take sponge baths.  Drink enough fluid to keep your urine pale yellow.  Do not use any products that contain nicotine or tobacco, such as cigarettes, e-cigarettes,  and chewing tobacco. These can delay healing after surgery. If you need help quitting, ask your health care provider.  Keep all follow-up visits as told by your health care provider. This is important. Contact a health care provider if:  You have pain that gets worse or does not get better with medicine, especially pain when you urinate.  You have difficulty urinating.  You feel nauseous or you vomit repeatedly during a period of more than 2 days after the procedure. Get help right away if:  Your urine is dark red or has blood clots in it.  You are leaking urine (have incontinence).  The end of the stent comes out of your urethra.  You cannot urinate.  You have sudden, sharp, or severe pain in your abdomen or lower back.  You have a fever.  You have swelling or pain in your legs.  You have difficulty breathing. Summary  After the procedure, it is common to have mild pain when you urinate that goes away within a few minutes after you urinate. This may  last for up to 1 week.  Watch for any blood in your urine. Call your health care provider if the amount of blood in your urine increases.  Take over-the-counter and prescription medicines only as told by your health care provider.  Drink enough fluid to keep your urine pale yellow. This information is not intended to replace advice given to you by your health care provider. Make sure you discuss any questions you have with your health care provider. Document Revised: 07/19/2018 Document Reviewed: 07/20/2018 Elsevier Patient Education  2020 [employer]   AMBULATORY SURGERY  DISCHARGE INSTRUCTIONS   1) The drugs that you were given will stay in your system until tomorrow so for the next 24 hours you should not:  A) Drive an automobile B) Make any legal decisions C) Drink any alcoholic beverage   2) You may resume regular meals tomorrow.  Today it is better to start with liquids and gradually work up to solid foods.  You may eat anything you prefer, but it is better to start with liquids, then soup and crackers, and gradually work up to solid foods.   3) Please notify your doctor immediately if you have any unusual bleeding, trouble breathing, redness and pain at the surgery site, drainage, fever, or pain not relieved by medication.    4) Additional Instructions:        Please contact your physician with any problems or Same Day Surgery at [phone], Monday through Friday 6 am to 4 pm, or [place] at [facility] number at [phone].

## 2020-01-04 NOTE — Anesthesia Preprocedure Evaluation (Addendum)
Anesthesia Evaluation  Patient identified by MRN, date of birth, ID band Patient awake    Reviewed: Allergy & Precautions, H&P , NPO status , Patient's Chart, lab work & pertinent test results, reviewed documented beta blocker date and time   Airway Mallampati: II  TM Distance: >3 FB Neck ROM: full    Dental  (+) Teeth Intact   Pulmonary neg pulmonary ROS, former smoker,    Pulmonary exam normal        Cardiovascular Exercise Tolerance: Good negative cardio ROS Normal cardiovascular exam Rate:Normal  Nml EKG 11/2019   Neuro/Psych negative neurological ROS  negative psych ROS   GI/Hepatic Neg liver ROS, hiatal hernia, GERD  ,  Endo/Other  negative endocrine ROS  Renal/GU Renal diseasenegative Renal ROS  negative genitourinary   Musculoskeletal   Abdominal   Peds  Hematology negative hematology ROS (+)   Anesthesia Other Findings Past Medical History: No date: Gout No date: Hiatal hernia No date: Polyp of colon Past Surgical History: No date: APPENDECTOMY No date: NASAL SINUS SURGERY BMI    Body Mass Index: 28.08 kg/m     Reproductive/Obstetrics negative OB ROS                           Anesthesia Physical Anesthesia Plan  ASA: II  Anesthesia Plan: General   Post-op Pain Management:    Induction: Intravenous  PONV Risk Score and Plan: Ondansetron and Treatment may vary due to age or medical condition  Airway Management Planned: Oral ETT  Additional Equipment:   Intra-op Plan:   Post-operative Plan: Extubation in OR  Informed Consent: I have reviewed the patients History and Physical, chart, labs and discussed the procedure including the risks, benefits and alternatives for the proposed anesthesia with the patient or authorized representative who has indicated his/her understanding and acceptance.     Dental Advisory Given  Plan Discussed with: CRNA  Anesthesia Plan  Comments:        Anesthesia Quick Evaluation

## 2020-01-08 NOTE — Anesthesia Postprocedure Evaluation (Signed)
Anesthesia Post Note  Patient: DYSHAUN BONZO  Procedure(s) Performed: RETROGRADE/URETEROSCOPY (Left Ureter) CYSTOSCOPY WITH STENT REPLACEMENT (Left Ureter)  Patient location during evaluation: PACU Anesthesia Type: General Level of consciousness: awake and alert Pain management: pain level controlled Vital Signs Assessment: post-procedure vital signs reviewed and stable Respiratory status: spontaneous breathing, nonlabored ventilation, respiratory function stable and patient connected to nasal cannula oxygen Cardiovascular status: blood pressure returned to baseline and stable Postop Assessment: no apparent nausea or vomiting Anesthetic complications: no     Last Vitals:  Vitals:   01/04/20 1628 01/04/20 1655  BP: (!) 134/92 132/80  Pulse: 91 89  Resp: 13 16  Temp: (!) 36.4 C 36.6 C  SpO2: 96% 98%    Last Pain:  Vitals:   01/05/20 1021  TempSrc:   PainSc: 0-No pain                 Yevette Edwards

## 2021-04-21 ENCOUNTER — Encounter: Payer: Self-pay | Admitting: Internal Medicine

## 2021-04-21 ENCOUNTER — Ambulatory Visit (INDEPENDENT_AMBULATORY_CARE_PROVIDER_SITE_OTHER): Payer: Managed Care, Other (non HMO) | Admitting: Internal Medicine

## 2021-04-21 ENCOUNTER — Other Ambulatory Visit: Payer: Self-pay

## 2021-04-21 VITALS — BP 119/78 | HR 74 | Temp 97.5°F | Ht 72.5 in | Wt 201.0 lb

## 2021-04-21 DIAGNOSIS — Z0001 Encounter for general adult medical examination with abnormal findings: Secondary | ICD-10-CM

## 2021-04-21 DIAGNOSIS — M353 Polymyalgia rheumatica: Secondary | ICD-10-CM

## 2021-04-21 DIAGNOSIS — I700 Atherosclerosis of aorta: Secondary | ICD-10-CM | POA: Diagnosis not present

## 2021-04-21 DIAGNOSIS — E785 Hyperlipidemia, unspecified: Secondary | ICD-10-CM | POA: Insufficient documentation

## 2021-04-21 DIAGNOSIS — E1169 Type 2 diabetes mellitus with other specified complication: Secondary | ICD-10-CM | POA: Insufficient documentation

## 2021-04-21 DIAGNOSIS — E663 Overweight: Secondary | ICD-10-CM | POA: Diagnosis not present

## 2021-04-21 DIAGNOSIS — I2510 Atherosclerotic heart disease of native coronary artery without angina pectoris: Secondary | ICD-10-CM

## 2021-04-21 DIAGNOSIS — E782 Mixed hyperlipidemia: Secondary | ICD-10-CM | POA: Diagnosis not present

## 2021-04-21 LAB — COMPLETE METABOLIC PANEL WITH GFR
AG Ratio: 1.8 (calc) (ref 1.0–2.5)
ALT: 47 U/L — ABNORMAL HIGH (ref 9–46)
AST: 35 U/L (ref 10–35)
Albumin: 4.5 g/dL (ref 3.6–5.1)
Alkaline phosphatase (APISO): 71 U/L (ref 35–144)
BUN: 11 mg/dL (ref 7–25)
CO2: 26 mmol/L (ref 20–32)
Calcium: 9.3 mg/dL (ref 8.6–10.3)
Chloride: 104 mmol/L (ref 98–110)
Creat: 0.9 mg/dL (ref 0.70–1.33)
GFR, Est African American: 109 mL/min/{1.73_m2} (ref >=60)
GFR, Est Non African American: 94 mL/min/{1.73_m2} (ref >=60)
Globulin: 2.5 g/dL (calc) (ref 1.9–3.7)
Glucose, Bld: 118 mg/dL — ABNORMAL HIGH (ref 65–99)
Potassium: 4.4 mmol/L (ref 3.5–5.3)
Sodium: 140 mmol/L (ref 135–146)
Total Bilirubin: 0.9 mg/dL (ref 0.2–1.2)
Total Protein: 7 g/dL (ref 6.1–8.1)

## 2021-04-21 NOTE — Progress Notes (Signed)
Subjective:    Patient ID: Eric Hogan, male    DOB: 11/21/1960, 60 y.o.   MRN: [id number]  HPI  Patient presents the clinic today for his annual exam.  He is also due to follow-up chronic conditions.  PMR: No longer on meds. He reports occasional stiffness in his neck and hands. He is following with rheumatology.  HLD with CAD/Aortic Atherosclerosis: His last LDL was not calculated, triglycerides 412, 02/2021.  He denies myalgias on Atorvastatin.  He tries to consume a low-fat diet.  Flu: 09/2020 Tetanus: 07/2015 COVID: Moderna Shingrix: never PSA screening: 04/2018 Colon screening: 07/2016, 5 years Vision screening: annually Dentist: biannually  Diet: He does eat some meat. He consumes fruits and veggies. He tries to avoid fried foods. He drinks mostly water, club soda. Exercise: Golf, walking, bike ride  Review of Systems     Past Medical History:  Diagnosis Date   Gout    Hiatal hernia    Polyp of colon     Current Outpatient Medications  Medication Sig Dispense Refill   atorvastatin (LIPITOR) 10 MG tablet TAKE 1 TABLET BY MOUTH EVERY DAY 90 tablet 0   potassium chloride SA (KLOR-CON) 20 MEQ tablet Take by mouth.     ciprofloxacin (CIPRO) 500 MG tablet Take 1 tablet (500 mg total) by mouth 2 (two) times daily. 6 tablet 0   docusate sodium (COLACE) 100 MG capsule Take 2 capsules (200 mg total) by mouth 2 (two) times daily. (Patient not taking: Reported on 04/21/2021) 120 capsule 3   fluticasone (FLONASE) 50 MCG/ACT nasal spray PLACE 2 SPRAYS DAILY INTO BOTH NOSTRILS. 16 g 6   predniSONE (DELTASONE) 10 MG tablet Take 1 tablet (10 mg total) by mouth daily with breakfast. 30 tablet 0   No current facility-administered medications for this visit.    Allergies  Allergen Reactions   Aspirin Hives and Swelling   Ibuprofen Swelling    Family History  Problem Relation Age of Onset   Hyperlipidemia Mother    Diabetes Father    Heart disease Father     Social  History   Socioeconomic History   Marital status: Divorced    Spouse name: Not on file   Number of children: Not on file   Years of education: Not on file   Highest education level: Not on file  Occupational History   Not on file  Tobacco Use   Smoking status: Former    Pack years: 0.00    Types: Cigarettes    Quit date: 02/20/2018    Years since quitting: 3.1   Smokeless tobacco: Never  Vaping Use   Vaping Use: Never used  Substance and Sexual Activity   Alcohol use: Yes    Alcohol/week: 0.0 standard drinks   Drug use: No   Sexual activity: Not on file  Other Topics Concern   Not on file  Social History Narrative   Not on file   Social Determinants of Health   Financial Resource Strain: Not on file  Food Insecurity: Not on file  Transportation Needs: Not on file  Physical Activity: Not on file  Stress: Not on file  Social Connections: Not on file  Intimate Partner Violence: Not on file     Constitutional: Denies fever, malaise, fatigue, headache or abrupt weight changes.  HEENT: Denies eye pain, eye redness, ear pain, ringing in the ears, wax buildup, runny nose, nasal congestion, bloody nose, or sore throat. Respiratory: Denies difficulty breathing, shortness of breath, cough  or sputum production.   Cardiovascular: Denies chest pain, chest tightness, palpitations or swelling in the hands or feet.  Gastrointestinal: Denies abdominal pain, bloating, constipation, diarrhea or blood in the stool.  GU: Denies urgency, frequency, pain with urination, burning sensation, blood in urine, odor or discharge. Musculoskeletal: Pt reports intermittent joint stiffness. Denies decrease in range of motion, difficulty with gait, muscle pain or joint swelling.  Skin: Denies redness, rashes, lesions or ulcercations.  Neurological: Denies dizziness, difficulty with memory, difficulty with speech or problems with balance and coordination.  Psych: Denies anxiety, depression, SI/HI.  No  other specific complaints in a complete review of systems (except as listed in HPI above).  Objective:   Physical Exam   BP 119/78 (BP Location: Right Arm, Patient Position: Sitting, Cuff Size: Large)   Pulse 74   Temp (!) 97.5 F (36.4 C) (Temporal)   Ht 6' 0.5" (1.842 m)   Wt 201 lb (91.2 kg)   SpO2 99%   BMI 26.89 kg/m  Wt Readings from Last 3 Encounters:  04/21/21 201 lb (91.2 kg)  05/25/18 212 lb 12.8 oz (96.5 kg)  04/22/18 211 lb (95.7 kg)    General: Appears his stated age, overweight, in NAD. Skin: Warm, dry and intact. No rashes, lesions or ulcerations noted. HEENT: Head: normal shape and size; Eyes: sclera white and EOMs intact;  Neck:  Neck supple, trachea midline. No masses, lumps or thyromegaly present.  Cardiovascular: Normal rate and rhythm. S1,S2 noted.  No murmur, rubs or gallops noted. No JVD or BLE edema. No carotid bruits noted. Pulmonary/Chest: Normal effort and positive vesicular breath sounds. No respiratory distress. No wheezes, rales or ronchi noted.  Abdomen: Soft and nontender. Normal bowel sounds. Ventral hernia noted. Liver, spleen and kidneys non palpable. Musculoskeletal: Strength 5/5 BUE/BLE. No difficulty with gait.  Neurological: Alert and oriented. Cranial nerves II-XII grossly intact. Coordination normal.  Psychiatric: Mood and affect normal. Behavior is normal. Judgment and thought content normal.    BMET    Component Value Date/Time   NA 138 01/31/2019 1058   NA 140 04/27/2018 0714   K 4.6 01/31/2019 1058   CL 103 01/31/2019 1058   CO2 27 01/31/2019 1058   GLUCOSE 111 01/31/2019 1058   BUN 16 01/31/2019 1058   BUN 13 04/27/2018 0714   CREATININE 0.87 01/31/2019 1058   CALCIUM 9.6 01/31/2019 1058   GFRNONAA 97 01/31/2019 1058   GFRAA 113 01/31/2019 1058    Lipid Panel     Component Value Date/Time   CHOL 212 (H) 05/25/2018 1053   TRIG 382 (H) 05/25/2018 1053   HDL 41 05/25/2018 1053   CHOLHDL 5.2 (H) 05/25/2018 1053    LDLCALC 116 (H) 05/25/2018 1053    CBC    Component Value Date/Time   WBC 6.7 01/31/2019 1058   RBC 4.72 01/31/2019 1058   HGB 14.5 01/31/2019 1058   HGB 14.3 04/27/2018 0714   HCT 41.3 01/31/2019 1058   HCT 41.0 04/27/2018 0714   PLT 251 01/31/2019 1058   PLT 212 04/27/2018 0714   MCV 87.5 01/31/2019 1058   MCV 88 04/27/2018 0714   MCH 30.7 01/31/2019 1058   MCHC 35.1 01/31/2019 1058   RDW 12.4 01/31/2019 1058   RDW 13.2 04/27/2018 0714   LYMPHSABS 1,729 01/31/2019 1058   LYMPHSABS 1.6 04/27/2018 0714   EOSABS 221 01/31/2019 1058   EOSABS 0.3 04/27/2018 0714   BASOSABS 60 01/31/2019 1058   BASOSABS 0.0 04/27/2018 0714  Hgb A1C Lab Results  Component Value Date   HGBA1C 5.6 05/25/2018           Assessment & Plan:   Preventative Health Maintenance:  Encouraged him to get a flu shot in the fall Tetanus UTD Encouraged him to get his COVID-vaccine He declines Shingrix vaccine today Colon screening due 07/2021 Encouraged him to consume a balanced diet and exercise regimen Advised him to see an eye doctor and dentist annually We will check CMET today. Lipid profile reviewed. He reports he gets PSA checked by urology.  RTC in 1 year, sooner if needed Nicki Reaper, NP  This visit occurred during the SARS-CoV-2 public health emergency.  Safety protocols were in place, including screening questions prior to the visit, additional usage of staff PPE, and extensive cleaning of exam room while observing appropriate contact time as indicated for disinfecting solutions.

## 2021-04-21 NOTE — Assessment & Plan Note (Signed)
Encourage diet and exercise for weight loss 

## 2021-04-21 NOTE — Assessment & Plan Note (Signed)
Lipid profile reviewed Encouraged him to consume a low fat diet Continue Atorvastatin, managed by cardiology

## 2021-04-21 NOTE — Patient Instructions (Signed)
Health Maintenance, Male Adopting a healthy lifestyle and getting preventive care are important in promoting health and wellness. Ask your health care provider about: The right schedule for you to have regular tests and exams. Things you can do on your own to prevent diseases and keep yourself healthy. What should I know about diet, weight, and exercise? Eat a healthy diet  Eat a diet that includes plenty of vegetables, fruits, low-fat dairy products, and lean protein. Do not eat a lot of foods that are high in solid fats, added sugars, or sodium.  Maintain a healthy weight Body mass index (BMI) is a measurement that can be used to identify possible weight problems. It estimates body fat based on height and weight. Your health care provider can help determine your BMI and help you achieve or maintain ahealthy weight. Get regular exercise Get regular exercise. This is one of the most important things you can do for your health. Most adults should: Exercise for at least 150 minutes each week. The exercise should increase your heart rate and make you sweat (moderate-intensity exercise). Do strengthening exercises at least twice a week. This is in addition to the moderate-intensity exercise. Spend less time sitting. Even light physical activity can be beneficial. Watch cholesterol and blood lipids Have your blood tested for lipids and cholesterol at 58 years of age, then havethis test every 5 years. You may need to have your cholesterol levels checked more often if: Your lipid or cholesterol levels are high. You are older than 58 years of age. You are at high risk for heart disease. What should I know about cancer screening? Many types of cancers can be detected early and may often be prevented. Depending on your health history and family history, you may need to have cancer screening at various ages. This may include screening for: Colorectal cancer. Prostate cancer. Skin cancer. Lung  cancer. What should I know about heart disease, diabetes, and high blood pressure? Blood pressure and heart disease High blood pressure causes heart disease and increases the risk of stroke. This is more likely to develop in people who have high blood pressure readings, are of African descent, or are overweight. Talk with your health care provider about your target blood pressure readings. Have your blood pressure checked: Every 3-5 years if you are 18-39 years of age. Every year if you are 40 years old or older. If you are between the ages of 65 and 75 and are a current or former smoker, ask your health care provider if you should have a one-time screening for abdominal aortic aneurysm (AAA). Diabetes Have regular diabetes screenings. This checks your fasting blood sugar level. Have the screening done: Once every three years after age 45 if you are at a normal weight and have a low risk for diabetes. More often and at a younger age if you are overweight or have a high risk for diabetes. What should I know about preventing infection? Hepatitis B If you have a higher risk for hepatitis B, you should be screened for this virus. Talk with your health care provider to find out if you are at risk forhepatitis B infection. Hepatitis C Blood testing is recommended for: Everyone born from 1945 through 1965. Anyone with known risk factors for hepatitis C. Sexually transmitted infections (STIs) You should be screened each year for STIs, including gonorrhea and chlamydia, if: You are sexually active and are younger than 58 years of age. You are older than 58 years of age   and your health care provider tells you that you are at risk for this type of infection. Your sexual activity has changed since you were last screened, and you are at increased risk for chlamydia or gonorrhea. Ask your health care provider if you are at risk. Ask your health care provider about whether you are at high risk for HIV.  Your health care provider may recommend a prescription medicine to help prevent HIV infection. If you choose to take medicine to prevent HIV, you should first get tested for HIV. You should then be tested every 3 months for as long as you are taking the medicine. Follow these instructions at home: Lifestyle Do not use any products that contain nicotine or tobacco, such as cigarettes, e-cigarettes, and chewing tobacco. If you need help quitting, ask your health care provider. Do not use street drugs. Do not share needles. Ask your health care provider for help if you need support or information about quitting drugs. Alcohol use Do not drink alcohol if your health care provider tells you not to drink. If you drink alcohol: Limit how much you have to 0-2 drinks a day. Be aware of how much alcohol is in your drink. In the [place] one drink equals one 12 oz bottle of beer (355 mL), one 5 oz glass of wine (148 mL), or one 1 oz glass of hard liquor (44 mL). General instructions Schedule regular health, dental, and eye exams. Stay current with your vaccines. Tell your health care provider if: You often feel depressed. You have ever been abused or do not feel safe at home. Summary Adopting a healthy lifestyle and getting preventive care are important in promoting health and wellness. Follow your health care provider's instructions about healthy diet, exercising, and getting tested or screened for diseases. Follow your health care provider's instructions on monitoring your cholesterol and blood pressure. This information is not intended to replace advice given to you by your health care provider. Make sure you discuss any questions you have with your healthcare provider. Document Revised: 10/05/2018 Document Reviewed: 10/05/2018 Elsevier Patient Education  2022 [employer] Inc[employer]  

## 2021-04-21 NOTE — Assessment & Plan Note (Signed)
Lipid profile reviewed Encouraged him to consume a low fat diet Continue Atorvastatin, managed by cardiology 

## 2021-04-21 NOTE — Assessment & Plan Note (Signed)
Not medicated He will continue to see rheumatology, will follow

## 2021-06-09 ENCOUNTER — Ambulatory Visit (INDEPENDENT_AMBULATORY_CARE_PROVIDER_SITE_OTHER): Payer: Managed Care, Other (non HMO) | Admitting: Internal Medicine

## 2021-06-09 ENCOUNTER — Encounter: Payer: Self-pay | Admitting: Internal Medicine

## 2021-06-09 ENCOUNTER — Other Ambulatory Visit: Payer: Self-pay

## 2021-06-09 ENCOUNTER — Ambulatory Visit
Admission: RE | Admit: 2021-06-09 | Discharge: 2021-06-09 | Disposition: A | Discharge status: Home / Self Care | Payer: Managed Care, Other (non HMO) | Source: Ambulatory Visit | Attending: Internal Medicine | Admitting: Internal Medicine

## 2021-06-09 ENCOUNTER — Ambulatory Visit
Admission: RE | Admit: 2021-06-09 | Discharge: 2021-06-09 | Disposition: A | Discharge status: Home / Self Care | Payer: Managed Care, Other (non HMO) | Attending: Internal Medicine | Admitting: Internal Medicine

## 2021-06-09 VITALS — BP 126/74 | HR 61 | Temp 97.5°F | Resp 17 | Ht 72.5 in | Wt 207.5 lb

## 2021-06-09 DIAGNOSIS — G8929 Other chronic pain: Secondary | ICD-10-CM

## 2021-06-09 DIAGNOSIS — Z6827 Body mass index (BMI) 27.0-27.9, adult: Secondary | ICD-10-CM

## 2021-06-09 DIAGNOSIS — E663 Overweight: Secondary | ICD-10-CM | POA: Diagnosis not present

## 2021-06-09 DIAGNOSIS — M542 Cervicalgia: Secondary | ICD-10-CM | POA: Insufficient documentation

## 2021-06-09 MED ORDER — CYCLOBENZAPRINE HCL 5 MG PO TABS: 5.0000 mg | ORAL_TABLET | Freq: Every evening | ORAL | 0 refills | Status: DC | PRN

## 2021-06-09 NOTE — Patient Instructions (Signed)
Neck Exercises Ask your health care provider which exercises are safe for you. Do exercises exactly as told by your health care provider and adjust them as directed. It is normal to feel mild stretching, pulling, tightness, or discomfort as you do these exercises. Stop right away if you feel sudden pain or your pain gets worse. Do not begin these exercises until told by your health care provider. Neck exercises can be important for many reasons. They can improve strength and maintain flexibility in your neck, which will help your upper back and preventneck pain. Stretching exercises Rotation neck stretching  Sit in a chair or stand up. Place your feet flat on the floor, shoulder width apart. Slowly turn your head (rotate) to the right until a slight stretch is felt. Turn it all the way to the right so you can look over your right shoulder. Do not tilt or tip your head. Hold this position for 10-30 seconds. Slowly turn your head (rotate) to the left until a slight stretch is felt. Turn it all the way to the left so you can look over your left shoulder. Do not tilt or tip your head. Hold this position for 10-30 seconds. Repeat __________ times. Complete this exercise __________ times a day. Neck retraction Sit in a sturdy chair or stand up. Look straight ahead. Do not bend your neck. Use your fingers to push your chin backward (retraction). Do not bend your neck for this movement. Continue to face straight ahead. If you are doing the exercise properly, you will feel a slight sensation in your throat and a stretch at the back of your neck. Hold the stretch for 1-2 seconds. Repeat __________ times. Complete this exercise __________ times a day. Strengthening exercises Neck press Lie on your back on a firm bed or on the floor with a pillow under your head. Use your neck muscles to push your head down on the pillow and straighten your spine. Hold the position as well as you can. Keep your head  facing up (in a neutral position) and your chin tucked. Slowly count to 5 while holding this position. Repeat __________ times. Complete this exercise __________ times a day. Isometrics These are exercises in which you strengthen the muscles in your neck while keeping your neck still (isometrics). Sit in a supportive chair and place your hand on your forehead. Keep your head and face facing straight ahead. Do not flex or extend your neck while doing isometrics. Push forward with your head and neck while pushing back with your hand. Hold for 10 seconds. Do the sequence again, this time putting your hand against the back of your head. Use your head and neck to push backward against the hand pressure. Finally, do the same exercise on either side of your head, pushing sideways against the pressure of your hand. Repeat __________ times. Complete this exercise __________ times a day. Prone head lifts Lie face-down (prone position), resting on your elbows so that your chest and upper back are raised. Start with your head facing downward, near your chest. Position your chin either on or near your chest. Slowly lift your head upward. Lift until you are looking straight ahead. Then continue lifting your head as far back as you can comfortably stretch. Hold your head up for 5 seconds. Then slowly lower it to your starting position. Repeat __________ times. Complete this exercise __________ times a day. Supine head lifts Lie on your back (supine position), bending your knees to point to the   ceiling and keeping your feet flat on the floor. Lift your head slowly off the floor, raising your chin toward your chest. Hold for 5 seconds. Repeat __________ times. Complete this exercise __________ times a day. Scapular retraction Stand with your arms at your sides. Look straight ahead. Slowly pull both shoulders (scapulae) backward and downward (retraction) until you feel a stretch between your shoulder blades in  your upper back. Hold for 10-30 seconds. Relax and repeat. Repeat __________ times. Complete this exercise __________ times a day. Contact a health care provider if: Your neck pain or discomfort gets much worse when you do an exercise. Your neck pain or discomfort does not improve within 2 hours after you exercise. If you have any of these problems, stop exercising right away. Do not do the exercises again unless your health care provider says that you can. Get help right away if: You develop sudden, severe neck pain. If this happens, stop exercising right away. Do not do the exercises again unless your health care provider says that you can. This information is not intended to replace advice given to you by your health care provider. Make sure you discuss any questions you have with your healthcare provider. Document Revised: 08/10/2018 Document Reviewed: 08/10/2018 Elsevier Patient Education  2022 Elsevier Inc.  

## 2021-06-09 NOTE — Assessment & Plan Note (Signed)
Encouraged diet and exercise for weight loss ?

## 2021-06-09 NOTE — Progress Notes (Signed)
Subjective:    Patient ID: Eric Hogan, male    DOB: [birth year], 60 y.o.   MRN: [id number]  HPI  Pt presents to the clinic today with c/o neck pain. He reports this started 6 months ago.  He describes the pain as sore and achy.  The pain does not radiate but is worse with movement.  He denies headaches, dizziness, numbness, tingling or weakness of his left upper extremity.  He denies any injury to the area.  He has tried changing his pillow with some relief of symptoms.  He has not taken any medications OTC for this.  Review of Systems  Past Medical History:  Diagnosis Date   Gout    Hiatal hernia    Polyp of colon     Current Outpatient Medications  Medication Sig Dispense Refill   atorvastatin (LIPITOR) 10 MG tablet TAKE 1 TABLET BY MOUTH EVERY DAY 90 tablet 0   potassium chloride SA (KLOR-CON) 20 MEQ tablet Take by mouth.     No current facility-administered medications for this visit.    Allergies  Allergen Reactions   Aspirin Hives and Swelling   Ibuprofen Swelling    Family History  Problem Relation Age of Onset   Hyperlipidemia Mother    Diabetes Father    Heart disease Father     Social History   Socioeconomic History   Marital status: Divorced    Spouse name: Not on file   Number of children: Not on file   Years of education: Not on file   Highest education level: Not on file  Occupational History   Not on file  Tobacco Use   Smoking status: Former    Types: Cigarettes    Quit date: 02/20/2018    Years since quitting: 3.3   Smokeless tobacco: Never  Vaping Use   Vaping Use: Never used  Substance and Sexual Activity   Alcohol use: Yes    Alcohol/week: 0.0 standard drinks   Drug use: No   Sexual activity: Not on file  Other Topics Concern   Not on file  Social History Narrative   Not on file   Social Determinants of Health   Financial Resource Strain: Not on file  Food Insecurity: Not on file  Transportation Needs: Not on file   Physical Activity: Not on file  Stress: Not on file  Social Connections: Not on file  Intimate Partner Violence: Not on file     Constitutional: Denies fever, malaise, fatigue, headache or abrupt weight changes.  Respiratory: Denies difficulty breathing, shortness of breath, cough or sputum production.   Cardiovascular: Denies chest pain, chest tightness, palpitations or swelling in the hands or feet.  Musculoskeletal: Patient reports neck pain.  Denies decrease in range of motion, difficulty with gait, or joint swelling.  Skin: Denies redness, rashes, lesions or ulcercations.  Neurological: Denies dizziness, numbness, tingling or weakness.  No other specific complaints in a complete review of systems (except as listed in HPI above).     Objective:   Physical Exam BP 126/74 (BP Location: Left Arm, Patient Position: Sitting, Cuff Size: Normal)   Pulse 61   Temp (!) 97.5 F (36.4 C) (Temporal)   Resp 17   Ht 6' 0.5" (1.842 m)   Wt 207 lb 8 oz (94.1 kg)   SpO2 99%   BMI 27.76 kg/m   Wt Readings from Last 3 Encounters:  04/21/21 201 lb (91.2 kg)  05/25/18 212 lb 12.8 oz (96.5 kg)  04/22/18  211 lb (95.7 kg)    General: Appears his stated age, overweight, in NAD. Skin: Warm, dry and intact. No rashes noted. Cardiovascular: Normal rate.  Radial pulses 2+ bilaterally. Pulmonary/Chest: Normal effort. Musculoskeletal: Normal flexion, extension, rotation and lateral bending of the cervical spine.  Pain with rotation and lateral bending bilaterally.  No bony tenderness noted of the cervical spine.  Pain with palpation of the left paracervical muscles at the base of the skull.  Strength 5/5 BUE.  Handgrips equal. Neurological: Alert and oriented.    BMET    Component Value Date/Time   NA 140 04/21/2021 0906   NA 140 04/27/2018 0714   K 4.4 04/21/2021 0906   CL 104 04/21/2021 0906   CO2 26 04/21/2021 0906   GLUCOSE 118 (H) 04/21/2021 0906   BUN 11 04/21/2021 0906   BUN 13  04/27/2018 0714   CREATININE 0.90 04/21/2021 0906   CALCIUM 9.3 04/21/2021 0906   GFRNONAA 94 04/21/2021 0906   GFRAA 109 04/21/2021 0906    Lipid Panel     Component Value Date/Time   CHOL 212 (H) 05/25/2018 1053   TRIG 382 (H) 05/25/2018 1053   HDL 41 05/25/2018 1053   CHOLHDL 5.2 (H) 05/25/2018 1053   LDLCALC 116 (H) 05/25/2018 1053    CBC    Component Value Date/Time   WBC 6.7 01/31/2019 1058   RBC 4.72 01/31/2019 1058   HGB 14.5 01/31/2019 1058   HGB 14.3 04/27/2018 0714   HCT 41.3 01/31/2019 1058   HCT 41.0 04/27/2018 0714   PLT 251 01/31/2019 1058   PLT 212 04/27/2018 0714   MCV 87.5 01/31/2019 1058   MCV 88 04/27/2018 0714   MCH 30.7 01/31/2019 1058   MCHC 35.1 01/31/2019 1058   RDW 12.4 01/31/2019 1058   RDW 13.2 04/27/2018 0714   LYMPHSABS 1,729 01/31/2019 1058   LYMPHSABS 1.6 04/27/2018 0714   EOSABS 221 01/31/2019 1058   EOSABS 0.3 04/27/2018 0714   BASOSABS 60 01/31/2019 1058   BASOSABS 0.0 04/27/2018 0714    Hgb A1C Lab Results  Component Value Date   HGBA1C 5.6 05/25/2018            Assessment & Plan:   Chronic Neck Pain:  Xray cervical spine today Can take Ibuprofen 400 mg BID as needed for neck pain RX for Flexeril 5 mg QHS prn- sedation caution given  Encouraged heat and massage Stretching exercises given  Will follow up after xray, return precautions discussed  Nicki Reaper, NP This visit occurred during the SARS-CoV-2 public health emergency.  Safety protocols were in place, including screening questions prior to the visit, additional usage of staff PPE, and extensive cleaning of exam room while observing appropriate contact time as indicated for disinfecting solutions.

## 2021-07-30 ENCOUNTER — Telehealth: Payer: Self-pay

## 2021-07-30 NOTE — Telephone Encounter (Signed)
Copied from CRM [id number]. Topic: General - Other >> Jul 30, 2021  9:56 AM Payton Spark N wrote: Reason for CRM: Pt called in stating he has been dealing with gout for 10 years and he has been taking a medication, but recently he has had it flair up and wants to try a different medication, please advise.

## 2021-08-01 ENCOUNTER — Encounter: Payer: Self-pay | Admitting: Internal Medicine

## 2021-08-01 ENCOUNTER — Other Ambulatory Visit: Payer: Self-pay

## 2021-08-01 ENCOUNTER — Ambulatory Visit (INDEPENDENT_AMBULATORY_CARE_PROVIDER_SITE_OTHER): Payer: Managed Care, Other (non HMO) | Admitting: Internal Medicine

## 2021-08-01 VITALS — BP 114/76 | HR 87 | Resp 17 | Ht 72.5 in | Wt 209.4 lb

## 2021-08-01 DIAGNOSIS — M10072 Idiopathic gout, left ankle and foot: Secondary | ICD-10-CM | POA: Diagnosis not present

## 2021-08-01 DIAGNOSIS — Z23 Encounter for immunization: Secondary | ICD-10-CM

## 2021-08-01 MED ORDER — PREDNISONE 10 MG PO TABS: ORAL_TABLET | ORAL | 0 refills | Status: DC

## 2021-08-01 NOTE — Patient Instructions (Signed)
Gout Gout is painful swelling of your joints. Gout is a type of arthritis. It is caused by having too much uric acid in your body. Uric acid is a chemical that is made when your body breaks down substances called purines. If your body has too much uric acid, sharp crystals can form and build up in your joints. This causes pain and swelling. Gout attacks can happen quickly and be very painful (acute gout). Over time, the attacks can affect more joints and happen more often (chronic gout). What are the causes? Too much uric acid in your blood. This can happen because: Your kidneys do not remove enough uric acid from your blood. Your body makes too much uric acid. You eat too many foods that are high in purines. These foods include organ meats, some seafood, and beer. Trauma or stress. What increases the risk? Having a family history of gout. Being male and middle-aged. Being male and having gone through menopause. Being very overweight (obese). Drinking alcohol, especially beer. Not having enough water in the body (being dehydrated). Losing weight too quickly. Having an organ transplant. Having lead poisoning. Taking certain medicines. Having kidney disease. Having a skin condition called psoriasis. What are the signs or symptoms? An attack of acute gout usually happens in just one joint. The most common place is the big toe. Attacks often start at night. Other joints that may be affected include joints of the feet, ankle, knee, fingers, wrist, or elbow. Symptoms of an attack may include: Very bad pain. Warmth. Swelling. Stiffness. Shiny, red, or purple skin. Tenderness. The affected joint may be very painful to touch. Chills and fever. Chronic gout may cause symptoms more often. More joints may be involved. You may also have white or yellow lumps (tophi) on your hands or feet or in other areas near your joints. How is this treated? Treatment for this condition has two phases:  treating an acute attack and preventing future attacks. Acute gout treatment may include: NSAIDs. Steroids. These are taken by mouth or injected into a joint. Colchicine. This medicine relieves pain and swelling. It can be given by mouth or through an IV tube. Preventive treatment may include: Taking small doses of NSAIDs or colchicine daily. Using a medicine that reduces uric acid levels in your blood. Making changes to your diet. You may need to see a food expert (dietitian) about what to eat and drink to prevent gout. Follow these instructions at home: During a gout attack  If told, put ice on the painful area: Put ice in a plastic bag. Place a towel between your skin and the bag. Leave the ice on for 20 minutes, 2-3 times a day. Raise (elevate) the painful joint above the level of your heart as often as you can. Rest the joint as much as possible. If the joint is in your leg, you may be given crutches. Follow instructions from your doctor about what you cannot eat or drink. Avoiding future gout attacks Eat a low-purine diet. Avoid foods and drinks such as: Liver. Kidney. Anchovies. Asparagus. Herring. Mushrooms. Mussels. Beer. Stay at a healthy weight. If you want to lose weight, talk with your doctor. Do not lose weight too fast. Start or continue an exercise plan as told by your doctor. Eating and drinking Drink enough fluids to keep your pee (urine) pale yellow. If you drink alcohol: Limit how much you use to: 0-1 drink a day for women. 0-2 drinks a day for men. Be aware of   how much alcohol is in your drink. In the [place] one drink equals one 12 oz bottle of beer (355 mL), one 5 oz glass of wine (148 mL), or one 1 oz glass of hard liquor (44 mL). General instructions Take over-the-counter and prescription medicines only as told by your doctor. Do not drive or use heavy machinery while taking prescription pain medicine. Return to your normal activities as told by your  doctor. Ask your doctor what activities are safe for you. Keep all follow-up visits as told by your doctor. This is important. Contact a doctor if: You have another gout attack. You still have symptoms of a gout attack after 10 days of treatment. You have problems (side effects) because of your medicines. You have chills or a fever. You have burning pain when you pee (urinate). You have pain in your lower back or belly. Get help right away if: You have very bad pain. Your pain cannot be controlled. You cannot pee. Summary Gout is painful swelling of the joints. The most common site of pain is the big toe, but it can affect other joints. Medicines and avoiding some foods can help to prevent and treat gout attacks. This information is not intended to replace advice given to you by your health care provider. Make sure you discuss any questions you have with your health care provider. Document Revised: 05/04/2018 Document Reviewed: 05/04/2018 Elsevier Patient Education  2022 [employer]  

## 2021-08-01 NOTE — Progress Notes (Signed)
Subjective:    Patient ID: Eric Hogan, male    DOB: [birth year], 60 y.o.   MRN: [id number]  HPI  Pt presents to the clinic today with c/o pain, redness, swelling and warmth at the base of his 2nd toe and ball of his left foot. He reports this started 1 week ago. He describes the pain as sharp. It has improved slightly. He denies numbness, tingling or weakness. He has tried drinking cherry juice with some relief of symptoms. He reports he used to be on Allopurinol in the past, but not in the last 4-5 years, when he had his last gout attack. Last uric acid 7.7, 09/2020.  Review of Systems  Past Medical History:  Diagnosis Date   Gout    Hiatal hernia    Polyp of colon     Current Outpatient Medications  Medication Sig Dispense Refill   atorvastatin (LIPITOR) 40 MG tablet Take 40 mg by mouth daily.     cyclobenzaprine (FLEXERIL) 5 MG tablet Take 1 tablet (5 mg total) by mouth at bedtime as needed for muscle spasms. 30 tablet 0   Potassium Citrate 15 MEQ (1620 MG) TBCR Take by mouth.     No current facility-administered medications for this visit.    Allergies  Allergen Reactions   Aspirin Hives and Swelling   Ibuprofen Swelling    Family History  Problem Relation Age of Onset   Hyperlipidemia Mother    Diabetes Father    Heart disease Father     Social History   Socioeconomic History   Marital status: Divorced    Spouse name: Not on file   Number of children: Not on file   Years of education: Not on file   Highest education level: Not on file  Occupational History   Not on file  Tobacco Use   Smoking status: Former    Types: Cigarettes    Quit date: 02/20/2018    Years since quitting: 3.4   Smokeless tobacco: Never  Vaping Use   Vaping Use: Never used  Substance and Sexual Activity   Alcohol use: Yes    Alcohol/week: 0.0 standard drinks   Drug use: No   Sexual activity: Not on file  Other Topics Concern   Not on file  Social History Narrative   Not on  file   Social Determinants of Health   Financial Resource Strain: Not on file  Food Insecurity: Not on file  Transportation Needs: Not on file  Physical Activity: Not on file  Stress: Not on file  Social Connections: Not on file  Intimate Partner Violence: Not on file     Constitutional: Denies fever, malaise, fatigue, headache or abrupt weight changes.  Respiratory: Denies difficulty breathing, shortness of breath, cough or sputum production.   Cardiovascular: Denies chest pain, chest tightness, palpitations or swelling in the hands or feet.  Musculoskeletal: Pt reports joint pain and swelling. Denies decrease in range of motion, difficulty with gait, muscle pain.  Skin: Pt reports slight redness of joint and ball of left foot. Denies  rashes, lesions or ulcercations.  Neurological: Denies numbness, tingling, weakness or problems with balance and coordination.   No other specific complaints in a complete review of systems (except as listed in HPI above).     Objective:   Physical Exam  Past Medical History:  Diagnosis Date   Gout    Hiatal hernia    Polyp of colon     Current Outpatient Medications  Medication  Sig Dispense Refill   atorvastatin (LIPITOR) 40 MG tablet Take 40 mg by mouth daily.     Potassium Citrate 15 MEQ (1620 MG) TBCR Take by mouth.     predniSONE (DELTASONE) 10 MG tablet Take 6 tabs on day 1, 5 tabs on day 2, 4 tabs on day 3, 3 tabs on day 4, 2 tabs on day 5, 1 tab on day 6 21 tablet 0   No current facility-administered medications for this visit.    Allergies  Allergen Reactions   Aspirin Hives and Swelling   Ibuprofen Swelling    Family History  Problem Relation Age of Onset   Hyperlipidemia Mother    Diabetes Father    Heart disease Father     Social History   Socioeconomic History   Marital status: Divorced    Spouse name: Not on file   Number of children: Not on file   Years of education: Not on file   Highest education level:  Not on file  Occupational History   Not on file  Tobacco Use   Smoking status: Former    Types: Cigarettes    Quit date: 02/20/2018    Years since quitting: 3.4   Smokeless tobacco: Never  Vaping Use   Vaping Use: Never used  Substance and Sexual Activity   Alcohol use: Yes    Alcohol/week: 0.0 standard drinks   Drug use: No   Sexual activity: Not on file  Other Topics Concern   Not on file  Social History Narrative   Not on file   Social Determinants of Health   Financial Resource Strain: Not on file  Food Insecurity: Not on file  Transportation Needs: Not on file  Physical Activity: Not on file  Stress: Not on file  Social Connections: Not on file  Intimate Partner Violence: Not on file     Constitutional: Denies fever, malaise, fatigue, headache or abrupt weight changes.  HEENT: Denies eye pain, eye redness, ear pain, ringing in the ears, wax buildup, runny nose, nasal congestion, bloody nose, or sore throat. Respiratory: Denies difficulty breathing, shortness of breath, cough or sputum production.   Cardiovascular: Denies chest pain, chest tightness, palpitations or swelling in the hands or feet.  Gastrointestinal: Denies abdominal pain, bloating, constipation, diarrhea or blood in the stool.  GU: Denies urgency, frequency, pain with urination, burning sensation, blood in urine, odor or discharge. Musculoskeletal: Denies decrease in range of motion, difficulty with gait, muscle pain or joint pain and swelling.  Skin: Denies redness, rashes, lesions or ulcercations.  Neurological: Denies dizziness, difficulty with memory, difficulty with speech or problems with balance and coordination.  Psych: Denies anxiety, depression, SI/HI.  No other specific complaints in a complete review of systems (except as listed in HPI above).  BP 114/76 (BP Location: Right Arm, Patient Position: Sitting, Cuff Size: Normal)   Pulse 87   Resp 17   Ht 6' 0.5" (1.842 m)   Wt 209 lb 6.4 oz  (95 kg)   SpO2 100%   BMI 28.01 kg/m   Wt Readings from Last 3 Encounters:  06/09/21 207 lb 8 oz (94.1 kg)  04/21/21 201 lb (91.2 kg)  05/25/18 212 lb 12.8 oz (96.5 kg)    General: Appears his stated age, overweight, in NAD. Skin: Warm, dry and intact. Slight redness noted at the base of the 2nd toe left foot. Cardiovascular: Normal rate. Pulmonary/Chest: Normal effort. Musculoskeletal: Gait slow and steady, no limping. Neurological: Alert and oriented.  BMET  Component Value Date/Time   NA 140 04/21/2021 0906   NA 140 04/27/2018 0714   K 4.4 04/21/2021 0906   CL 104 04/21/2021 0906   CO2 26 04/21/2021 0906   GLUCOSE 118 (H) 04/21/2021 0906   BUN 11 04/21/2021 0906   BUN 13 04/27/2018 0714   CREATININE 0.90 04/21/2021 0906   CALCIUM 9.3 04/21/2021 0906   GFRNONAA 94 04/21/2021 0906   GFRAA 109 04/21/2021 0906    Lipid Panel     Component Value Date/Time   CHOL 212 (H) 05/25/2018 1053   TRIG 382 (H) 05/25/2018 1053   HDL 41 05/25/2018 1053   CHOLHDL 5.2 (H) 05/25/2018 1053   LDLCALC 116 (H) 05/25/2018 1053    CBC    Component Value Date/Time   WBC 6.7 01/31/2019 1058   RBC 4.72 01/31/2019 1058   HGB 14.5 01/31/2019 1058   HGB 14.3 04/27/2018 0714   HCT 41.3 01/31/2019 1058   HCT 41.0 04/27/2018 0714   PLT 251 01/31/2019 1058   PLT 212 04/27/2018 0714   MCV 87.5 01/31/2019 1058   MCV 88 04/27/2018 0714   MCH 30.7 01/31/2019 1058   MCHC 35.1 01/31/2019 1058   RDW 12.4 01/31/2019 1058   RDW 13.2 04/27/2018 0714   LYMPHSABS 1,729 01/31/2019 1058   LYMPHSABS 1.6 04/27/2018 0714   EOSABS 221 01/31/2019 1058   EOSABS 0.3 04/27/2018 0714   BASOSABS 60 01/31/2019 1058   BASOSABS 0.0 04/27/2018 0714    Hgb A1C Lab Results  Component Value Date   HGBA1C 5.6 05/25/2018            Assessment & Plan:   Gout of Left Foot:  Flare seems to be improving but will give RX for Pred Taper x 6 days over the weekend in case it worsens again Discussed  Indomethacin to keep on hand for flares but he has allergy to Ibuprofen No indication to restart Allopurinol at this time given rarity of flares and normal uric acid level. Encouraged low purine diet  Return precautions discussed  Nicki Reaper, NP This visit occurred during the SARS-CoV-2 public health emergency.  Safety protocols were in place, including screening questions prior to the visit, additional usage of staff PPE, and extensive cleaning of exam room while observing appropriate contact time as indicated for disinfecting solutions.

## 2021-12-23 ENCOUNTER — Ambulatory Visit: Payer: Self-pay | Admitting: *Deleted

## 2021-12-23 ENCOUNTER — Telehealth: Payer: Managed Care, Other (non HMO) | Admitting: Nurse Practitioner

## 2021-12-23 DIAGNOSIS — M1A9XX0 Chronic gout, unspecified, without tophus (tophi): Secondary | ICD-10-CM

## 2021-12-23 NOTE — Telephone Encounter (Signed)
This encounter was created in error - please disregard.

## 2021-12-23 NOTE — Telephone Encounter (Signed)
He at least needs to schedule a virtual appt

## 2021-12-23 NOTE — Telephone Encounter (Signed)
Per agent: "Pt is having really bad gout flare up again last seen last Oct. Pt too busy for appt as having some surgery this week in midst of this flare up. Had old prednisone but is not working, denied appt and wants something called in till can get diet under control.  Nurse call back okay  needs to know what is out there to get thru this week. Fu at [phone] "   Chief Complaint: foot pain , great toe pain Symptoms: Right foot pain, > at big toe, redness, warmth, swelling. "Gout flare up." Frequency: States sprained ankle 3-4 weeks go, ankle pain, negative fx.  Now with foot pain , top of foot , into great toe. Pertinent Negatives: Patient denies fever Disposition: [] ED /[] Urgent Care (no appt availability in office) / [x] Appointment(In office/virtual)/ []  [facility]/ [] Home Care/ [] Refused Recommended Disposition /[] [facility]/ []  Follow-up with PCP Additional Notes: Pt declines appt. Requesting med be called in for "Gout flare up."  States has been taking prednisone he had on hand "Took 1-2 day for 6 days, no help." Declines appt. Please advise   Reason for Disposition  Pain in the big toe joint  Answer Assessment - Initial Assessment Questions 1. ONSET: "When did the pain start?"      2-3 days to toe 2. LOCATION: "Where is the pain located?"      Right foot 3. PAIN: "How bad is the pain?"    (Scale 1-10; or mild, moderate, severe)  - MILD (1-3): doesn't interfere with normal activities.   - MODERATE (4-7): interferes with normal activities (e.g., work or school) or awakens from sleep, limping.   - SEVERE (8-10): excruciating pain, unable to do any normal activities, unable to walk.      5/10 Awake at night, Cannot bear weight 4. WORK OR EXERCISE: "Has there been any recent work or exercise that involved this part of the body?"      No 5. CAUSE: "What do you think is causing the foot pain?"     Gout 6. OTHER SYMPTOMS: "Do you have any other symptoms?"  (e.g., leg pain, rash, fever, numbness)     No  Protocols used: Foot Pain-A-AH

## 2021-12-23 NOTE — Telephone Encounter (Signed)
Pt called after E-visit. E-visit. E-visit referred pt back to PCP for treatment.   Pt offered first available appt 12/24/2020 in the morning. Pt was unable to take appt. d/t prior commitment.  Appointment made for 12/24/2021 in the afternoon.  Pt expresses that pain is very bad. Offered UC as an alternative this evening.  Pt really wanted medication to get gout flare under control. Pt has a history of gout and believes this is what is wrong with foot.

## 2021-12-23 NOTE — Progress Notes (Signed)
°  Based on what you shared with me, I feel your condition warrants further evaluation and I recommend that you be seen in a face to face visit. °  °NOTE: There will be NO CHARGE for this eVisit °  °If you are having a true medical emergency please call 911.   °  ° For an urgent face to face visit, [facility] has six urgent care centers for your convenience:  °  ° [facility] [place] °Get Driving Directions °[phone] °[address] °[place], [place] [address] °  ° [facility] ([place]) °Get Driving Directions °[phone] °[address] °[place], [place] [address] ° °[facility] ([place] [facility] [place]) °Get Driving Directions °[phone] °[address] °[place],  [place]  [address] ° °[facility] °Get Driving Directions °[phone] °[address] °[place], [place] [address] °  °[facility] [place] °Get Driving Directions  °[phone] °[address] °[address] °[place], [place] [address] °  °[facility] [place] °Get Driving Directions °[phone] °[address] °[place],  [address] ° °Your MyChart E-visit questionnaire answers were reviewed by a board certified advanced clinical practitioner to complete your personal care plan based on your specific symptoms.  Thank you for using e-Visits. °

## 2021-12-23 NOTE — Telephone Encounter (Signed)
Pt advised.  He is going to try and do an E-visit through My Chart.   Thanks,   -Vernona Rieger

## 2021-12-24 ENCOUNTER — Encounter: Payer: Self-pay | Admitting: Family Medicine

## 2021-12-24 ENCOUNTER — Other Ambulatory Visit: Payer: Self-pay

## 2021-12-24 ENCOUNTER — Ambulatory Visit (INDEPENDENT_AMBULATORY_CARE_PROVIDER_SITE_OTHER): Payer: Managed Care, Other (non HMO) | Admitting: Family Medicine

## 2021-12-24 ENCOUNTER — Ambulatory Visit: Payer: Managed Care, Other (non HMO) | Admitting: Family Medicine

## 2021-12-24 VITALS — BP 125/80 | HR 64 | Ht 72.5 in | Wt 211.2 lb

## 2021-12-24 DIAGNOSIS — M109 Gout, unspecified: Secondary | ICD-10-CM

## 2021-12-24 MED ORDER — PREDNISONE 10 MG PO TABS: ORAL_TABLET | ORAL | 0 refills | Status: DC

## 2021-12-24 MED ORDER — COLCHICINE 0.6 MG PO TABS: ORAL_TABLET | ORAL | 2 refills | Status: DC

## 2021-12-24 NOTE — Patient Instructions (Addendum)
Thank you for coming to the office today. ? ?Your Right pain is most likely caused an Acute Gout Flare ?- Gout is a chronic problem that will have episodic flare ups with pain, redness, swelling of a joint, most common spots are big toe, foot and ankle, knee or sometimes hands or wrists. It is caused by small crystals made of Uric Acid that form in the joint causing pain and swelling. ? ?Start Colchicine (Mitigare) today - First dose take 2 tabs (1.2mg  total), wait 1 hour then take 1 tab (0.6mg ). Then next day start with 1 tab daily until pain resolves, if after 3 days pain is not resolving then you can increase to 1 tab twice a day until resolved. Continue same dose you were on when pain resolved for about 2-3 days AFTER pain is completely gone.  ?  ?For all gout flares, the sooner you start the medication, then the shorter the flare lasts. Go ahead and start taking colchicine as soon as you get significant gout pain and swelling again in the future, and if it is not improving within 48 hours then you can follow-up at our office. OR if you don't start medication you can come to the office within 24-48 hours for treatment here. ? ?Gout flares can repeat again soon after they resolve in the same spot or other joints, and may need repeat treatment. ? ?Our goal is to prevent future gout flares. Try to avoid dietary triggers that are the most common causes of gout flares. ?- Avoid the following foods/drinks: ?- Red meat, organ meat (liver) ?- Alcohol (especially beer, also wine, liquor) ?- Processed foods / carbs (white bread, white rice, pasta, sugar) ?- Sugary drinks (sweet tea, soda) ?- Shellfish, shrimp / lobster ? ?- Foods that are preferred to eat: ?- Beans, Lentils, Whole grains, Quinoa ?- Fruits, Vegetables ?- Dairy, Cheese, Yogurt ?- Soy based protein ? ?Future discuss with Rheumatology and future repeat Uric Acid, consider other preventative therapies. ?--------- ? ? ?Gout, treatment (acute flares): ?Day 1:  Oral: 1.2 mg at the first sign of flare, followed by 0.6 mg after 1 hour (Ref); maximum total dose: 1.8 mg/day on day 1 (Ref). Initiate as soon as possible, ideally within 12 to 24 hours of flare onset (Ref); consider alternative agents if >36 hours since flare onset (Ref). Note: In patients who were already receiving prophylactic colchicine at the time of their flare, some experts give the higher 1.8 mg/day dosing regimen on day 1 of the flare, in place of the usual prophylactic dose (Ref). ?Day 2 and thereafter: Oral: 0.6 mg once or twice daily until flare resolves (Ref). Some experts continue for 2 to 3 days after flare resolves (Ref). Note: In patients who were already receiving prophylaxis at the time of their flare, some experts give 0.6 mg twice daily (total dose: 1.2 mg/day) from day 2 until ~48 hours after flare resolution, and then resume the previous prophylactic dose (Ref). ? ? ?Please schedule a Follow-up Appointment to: Return if symptoms worsen or fail to improve. ? ?If you have any other questions or concerns, please feel free to call the office or send a message through [facility]. You may also schedule an earlier appointment if necessary. ? ?Additionally, you may be receiving a survey about your experience at our office within a few days to 1 week by e-mail or mail. We value your feedback. ? ?Nobie Putnam, DO ?[facility] ?

## 2021-12-24 NOTE — Progress Notes (Signed)
? ?Subjective:  ? ? Patient ID: Eric Hogan, male    DOB: 10/15/1961, 61 y.o.   MRN: [id number] ? ?Eric Hogan is a 61 y.o. male presenting on 12/24/2021 for Gout ? ?PCP Nicki Reaper, FNP ? ?Patient presents for a same day appointment. ? ? ?HPI ? ?Gout, recurrent ?Reports significant recent flare of gout. He reports history of acute rolled ankle R side 3-4 weeks ago, and it was improved after it healed. Then recently his R ankle started to flare up worsening, he saw Urgent Care 12/16/21 and they did X-rays, did not find cause of an issue. He rested off foot / ankle, and took NSAID PRN, Tylenol, Ankle Brace. ? ?He did take Prednisone 10mg  x 2 = 20mg  daily for 2-3 days without any significant relief. ? ?Yesterday he could not walk on R ankle, interfered with sleep and used cane. He woke up today and some mild improvement. ? ?He tried cherry juice. ? ?In past years was on Colchicine PRN in past with good results, also was on Allopurinol with good results, then eventually came off of it. ? ?He has a [facility] for PMR. ? ? ? ?Depression screen [facility] 2/9 04/21/2021 05/25/2018  ?Decreased Interest 0 0  ?Down, Depressed, Hopeless 0 0  ?PHQ - 2 Score 0 0  ?Altered sleeping 0 1  ?Tired, decreased energy 0 1  ?Change in appetite 0 0  ?Feeling bad or failure about yourself  0 0  ?Trouble concentrating 0 0  ?Moving slowly or fidgety/restless 0 0  ?Suicidal thoughts 0 0  ?PHQ-9 Score 0 2  ?Difficult doing work/chores Not difficult at all Not difficult at all  ? ? ?Social History  ? ?Tobacco Use  ? Smoking status: Former  ?  Types: Cigarettes  ?  Quit date: 02/20/2018  ?  Years since quitting: 3.8  ? Smokeless tobacco: Never  ?Vaping Use  ? Vaping Use: Never used  ?Substance Use Topics  ? Alcohol use: Yes  ?  Alcohol/week: 0.0 standard drinks  ? Drug use: No  ? ? ?Review of Systems ?Per HPI unless specifically indicated above ? ?   ?Objective:  ?  ?BP 125/80   Pulse 64   Ht 6' 0.5" (1.842 m)   Wt 211 lb 3.2 oz (95.8 kg)    SpO2 99%   BMI 28.25 kg/m?   ?Wt Readings from Last 3 Encounters:  ?12/24/21 211 lb 3.2 oz (95.8 kg)  ?08/01/21 209 lb 6.4 oz (95 kg)  ?06/09/21 207 lb 8 oz (94.1 kg)  ?  ?Physical Exam ?Vitals and nursing note reviewed.  ?Constitutional:   ?   General: He is not in acute distress. ?   Appearance: Normal appearance. He is well-developed. He is not diaphoretic.  ?   Comments: Well-appearing, comfortable, cooperative  ?HENT:  ?   Head: Normocephalic and atraumatic.  ?Eyes:  ?   General:     ?   Right eye: No discharge.     ?   Left eye: No discharge.  ?   Conjunctiva/sclera: Conjunctivae normal.  ?Cardiovascular:  ?   Rate and Rhythm: Normal rate.  ?Pulmonary:  ?   Effort: Pulmonary effort is normal.  ?Musculoskeletal:  ?   Comments: Antalgic gait due to pain ? ?Right Foot ?Mild swelling, minimal redness or discoloration but not really erythema. Mild tender Great toe MTP and medial ankle but no point or bony tenderness. Seems improved today  ?Skin: ?   General: Skin  is warm and dry.  ?   Findings: No erythema or rash.  ?Neurological:  ?   Mental Status: He is alert and oriented to person, place, and time.  ?Psychiatric:     ?   Mood and Affect: Mood normal.     ?   Behavior: Behavior normal.     ?   Thought Content: Thought content normal.  ?   Comments: Well groomed, good eye contact, normal speech and thoughts  ? ?Results for orders placed or performed in visit on 04/21/21  ?COMPLETE METABOLIC PANEL WITH GFR  ?Result Value Ref Range  ? Glucose, Bld 118 (H) 65 - 99 mg/dL  ? BUN 11 7 - 25 mg/dL  ? Creat 0.90 0.70 - 1.33 mg/dL  ? GFR, Est Non African American 94 > OR = 60 mL/min/1.70m2  ? GFR, Est African American 109 > OR = 60 mL/min/1.54m2  ? BUN/Creatinine Ratio NOT APPLICABLE 6 - 22 (calc)  ? Sodium 140 135 - 146 mmol/L  ? Potassium 4.4 3.5 - 5.3 mmol/L  ? Chloride 104 98 - 110 mmol/L  ? CO2 26 20 - 32 mmol/L  ? Calcium 9.3 8.6 - 10.3 mg/dL  ? Total Protein 7.0 6.1 - 8.1 g/dL  ? Albumin 4.5 3.6 - 5.1 g/dL  ?  Globulin 2.5 1.9 - 3.7 g/dL (calc)  ? AG Ratio 1.8 1.0 - 2.5 (calc)  ? Total Bilirubin 0.9 0.2 - 1.2 mg/dL  ? Alkaline phosphatase (APISO) 71 35 - 144 U/L  ? AST 35 10 - 35 U/L  ? ALT 47 (H) 9 - 46 U/L  ?HM COLONOSCOPY  ?Result Value Ref Range  ? HM Colonoscopy See Report (in chart) See Report (in chart), Patient Reported  ? ?   ?Assessment & Plan:  ? ?Problem List Items Addressed This Visit   ?None ?Visit Diagnoses   ? ? Acute gout of right ankle, unspecified cause    -  Primary  ? Relevant Medications  ? colchicine 0.6 MG tablet  ? predniSONE (DELTASONE) 10 MG tablet  ? ?  ?  ?Clinically consistent with acute gout flare of Right Foot/ankle great toe MTP, onset 2+ days with worsening symptoms, he had recent R Ankle sprain possibly question if that original one was gout flare. He has known gout but no longer on preventative ?Past on allopurinol before ?Has [facility]ology at [facility] for other issue PMR ? ?Differential Dx - unlikely trauma without known inciting injury, unlikely OA, no sign of infection or systemic symptoms. ?No longer on uric acid lowering therapy ?- Last uric acid level >7 2+ year ago ? ?Plan: ?1. Start colchicine (day 1 dose is higher as prescribed) - 0.6mg  daily then increase to BID if needed, within 72 hours of onset, take daily until 2-3 days after resolved ?2. Also gave rx of Prednisone taper over 8 days IF NEEDED if colchicine does not resolve. ? ?Future reconsider prophylaxis therapy again allopurinol or other, he may ask his Rheumatologist about further gout therapy ? ?Avoid excessive ambulation, relative rest, ice if helps, can take Tylenol PRN ? ?Avoid food triggers (red meat, alcohol) ? ?Follow-up  within 4 weeks once acute flare resolved, discuss uric acid lower therapy and prevention ? ? ?Meds ordered this encounter  ?Medications  ? colchicine 0.6 MG tablet  ?  Sig: At start of acute flare, take 2 pills (1.2mg ) then repeat 1 pill (0.6mg ) 1 hour later, then take 1 pill daily for up to 7-10  days,  can increase to twice daily if need.  ?  Dispense:  30 tablet  ?  Refill:  2  ? predniSONE (DELTASONE) 10 MG tablet  ?  Sig: Take 4 tablets (40mg ) for 2 days, then 3 tab (30mg ) for 2 days, then 2 tab (20mg ) for 2 days, then 1 tab (10mg ) for 2 days.  ?  Dispense:  20 tablet  ?  Refill:  0  ? ? ? ?Follow up plan: ?Return if symptoms worsen or fail to improve. ? ? ? , DO ?[facility] ?12/24/2021, 2:46 PM ?

## 2021-12-24 NOTE — Telephone Encounter (Signed)
He has an appt with Dr. Raliegh Ip today ?

## 2022-01-15 ENCOUNTER — Ambulatory Visit: Payer: Self-pay | Admitting: *Deleted

## 2022-01-15 NOTE — Telephone Encounter (Signed)
Summary: advice - strep throat  ? Pt called in stating he thinks he might have strep throat, pt needed advice / wanted to know if he should get a strep test.   ?  ? ?Called patient to review sx sore throat. Busy signal. Unable to leave message.  ?

## 2022-01-15 NOTE — Telephone Encounter (Signed)
2nd call attempted to contact patient and review sx. Busy signal unable to leave message.  ?

## 2022-01-16 ENCOUNTER — Other Ambulatory Visit: Payer: Self-pay | Admitting: Otolaryngology

## 2022-01-16 DIAGNOSIS — R42 Dizziness and giddiness: Secondary | ICD-10-CM

## 2022-01-16 DIAGNOSIS — H9312 Tinnitus, left ear: Secondary | ICD-10-CM

## 2022-01-27 ENCOUNTER — Ambulatory Visit
Admission: RE | Admit: 2022-01-27 | Discharge: 2022-01-27 | Disposition: A | Discharge status: Home / Self Care | Payer: Managed Care, Other (non HMO) | Source: Ambulatory Visit | Attending: Otolaryngology | Admitting: Otolaryngology

## 2022-01-27 DIAGNOSIS — H9312 Tinnitus, left ear: Secondary | ICD-10-CM

## 2022-01-27 DIAGNOSIS — R42 Dizziness and giddiness: Secondary | ICD-10-CM

## 2022-01-27 MED ORDER — GADOBENATE DIMEGLUMINE 529 MG/ML IV SOLN
20.0000 mL | Freq: Once | INTRAVENOUS | Status: AC | PRN
  Administered 2022-01-27: 20 mL via INTRAVENOUS

## 2022-03-06 ENCOUNTER — Ambulatory Visit (INDEPENDENT_AMBULATORY_CARE_PROVIDER_SITE_OTHER): Payer: Managed Care, Other (non HMO) | Admitting: Internal Medicine

## 2022-03-06 ENCOUNTER — Encounter: Payer: Self-pay | Admitting: Internal Medicine

## 2022-03-06 VITALS — BP 114/76 | HR 84 | Temp 97.5°F | Wt 204.0 lb

## 2022-03-06 DIAGNOSIS — E782 Mixed hyperlipidemia: Secondary | ICD-10-CM

## 2022-03-06 DIAGNOSIS — E1165 Type 2 diabetes mellitus with hyperglycemia: Secondary | ICD-10-CM | POA: Diagnosis not present

## 2022-03-06 DIAGNOSIS — E663 Overweight: Secondary | ICD-10-CM

## 2022-03-06 DIAGNOSIS — I2510 Atherosclerotic heart disease of native coronary artery without angina pectoris: Secondary | ICD-10-CM

## 2022-03-06 DIAGNOSIS — I700 Atherosclerosis of aorta: Secondary | ICD-10-CM | POA: Diagnosis not present

## 2022-03-06 DIAGNOSIS — Z6827 Body mass index (BMI) 27.0-27.9, adult: Secondary | ICD-10-CM

## 2022-03-06 DIAGNOSIS — M353 Polymyalgia rheumatica: Secondary | ICD-10-CM

## 2022-03-06 DIAGNOSIS — K219 Gastro-esophageal reflux disease without esophagitis: Secondary | ICD-10-CM

## 2022-03-06 LAB — POCT GLYCOSYLATED HEMOGLOBIN (HGB A1C): Hemoglobin A1C: 10.7 % — AB (ref 4.0–5.6)

## 2022-03-06 MED ORDER — GLIPIZIDE 10 MG PO TABS: ORAL_TABLET | ORAL | 0 refills | Status: DC

## 2022-03-06 NOTE — Progress Notes (Signed)
? ?Subjective:  ? ? Patient ID: Eric Hogan, male    DOB: 12-17-1960, 61 y.o.   MRN: [id number] ? ?HPI ? ?Presents the clinic today for follow-up of chronic conditions. ? ?PMR: He is not currently taking any medications for this.  He follows with rheumatology. ? ?GERD: He is not taking Omeprazole.  There is no upper GI on file. ? ?HLD with CAD, Aortic Atherosclerosis: His last LDL was no calculated, triglycerides 412, 02/2021.  He is not taking Atorvastatin.  He tries to consume low-fat diet. ? ?Of note, his recent glucose was 389.  He reports this was not fasting.  He has never been told that he is diabetic in the past. His sugars have been running 400+ at home. ? ?Review of Systems ? ?   ?Past Medical History:  ?Diagnosis Date  ? Gout   ? Hiatal hernia   ? Polyp of colon   ? ? ?Current Outpatient Medications  ?Medication Sig Dispense Refill  ? atorvastatin (LIPITOR) 40 MG tablet Take 40 mg by mouth daily.    ? colchicine 0.6 MG tablet At start of acute flare, take 2 pills (1.2mg ) then repeat 1 pill (0.6mg ) 1 hour later, then take 1 pill daily for up to 7-10 days, can increase to twice daily if need. 30 tablet 2  ? omeprazole (PRILOSEC) 20 MG capsule Take 20 mg by mouth every morning.    ? Potassium Citrate 15 MEQ (1620 MG) TBCR Take by mouth.    ? predniSONE (DELTASONE) 10 MG tablet Take 4 tablets (40mg ) for 2 days, then 3 tab (30mg ) for 2 days, then 2 tab (20mg ) for 2 days, then 1 tab (10mg ) for 2 days. 20 tablet 0  ? ?No current facility-administered medications for this visit.  ? ? ?Allergies  ?Allergen Reactions  ? Aspirin Hives and Swelling  ? Ibuprofen Swelling  ? ? ?Family History  ?Problem Relation Age of Onset  ? Hyperlipidemia Mother   ? Diabetes Father   ? Heart disease Father   ? ? ?Social History  ? ?Socioeconomic History  ? Marital status: Divorced  ?  Spouse name: Not on file  ? Number of children: Not on file  ? Years of education: Not on file  ? Highest education level: Not on file  ?Occupational  History  ? Not on file  ?Tobacco Use  ? Smoking status: Former  ?  Types: Cigarettes  ?  Quit date: 02/20/2018  ?  Years since quitting: 4.0  ? Smokeless tobacco: Never  ?Vaping Use  ? Vaping Use: Never used  ?Substance and Sexual Activity  ? Alcohol use: Yes  ?  Alcohol/week: 0.0 standard drinks  ? Drug use: No  ? Sexual activity: Not on file  ?Other Topics Concern  ? Not on file  ?Social History Narrative  ? Not on file  ? ?Social Determinants of Health  ? ?Financial Resource Strain: Not on file  ?Food Insecurity: Not on file  ?Transportation Needs: Not on file  ?Physical Activity: Not on file  ?Stress: Not on file  ?Social Connections: Not on file  ?Intimate Partner Violence: Not on file  ? ? ? ?Constitutional: Denies fever, malaise, fatigue, headache or abrupt weight changes.  ?HEENT: Denies eye pain, eye redness, ear pain, ringing in the ears, wax buildup, runny nose, nasal congestion, bloody nose, or sore throat. ?Respiratory: Denies difficulty breathing, shortness of breath, cough or sputum production.   ?Cardiovascular: Denies chest pain, chest tightness, palpitations or swelling  in the hands or feet.  ?Gastrointestinal: Pt reports increased thirst. Denies abdominal pain, bloating, constipation, diarrhea or blood in the stool.  ?GU: Pt reports frequency urination. Denies urgency, frequency, pain with urination, burning sensation, blood in urine, odor or discharge. ?Musculoskeletal: Patient reports intermittent joint pain.  Denies decrease in range of motion, difficulty with gait, muscle pain or joint swelling.  ?Skin: Denies redness, rashes, lesions or ulcercations.  ?Neurological: Denies dizziness, difficulty with memory, difficulty with speech or problems with balance and coordination.  ? ?No other specific complaints in a complete review of systems (except as listed in HPI above). ? ?Objective:  ? Physical Exam ? ?BP 114/76 (BP Location: Left Arm, Patient Position: Sitting, Cuff Size: Large)   Pulse 84    Temp (!) 97.5 ?F (36.4 ?C) (Temporal)   Wt 204 lb (92.5 kg)   SpO2 99%   BMI 27.29 kg/m?  ? ?Wt Readings from Last 3 Encounters:  ?12/24/21 211 lb 3.2 oz (95.8 kg)  ?08/01/21 209 lb 6.4 oz (95 kg)  ?06/09/21 207 lb 8 oz (94.1 kg)  ? ? ?General: Appears his stated age, overweight in NAD. ?Skin: Warm, dry and intact. No ulcerations noted. ?HEENT: Head: normal shape and size; Eyes: EOMs intact;  ?Cardiovascular: Normal rate. ?Pulmonary/Chest: Normal effort. ?Neurological: Alert and oriented.  ? ?BMET ?   ?Component Value Date/Time  ? NA 140 04/21/2021 0906  ? NA 140 04/27/2018 0714  ? K 4.4 04/21/2021 0906  ? CL 104 04/21/2021 0906  ? CO2 26 04/21/2021 0906  ? GLUCOSE 118 (H) 04/21/2021 0906  ? BUN 11 04/21/2021 0906  ? BUN 13 04/27/2018 0714  ? CREATININE 0.90 04/21/2021 0906  ? CALCIUM 9.3 04/21/2021 0906  ? GFRNONAA 94 04/21/2021 0906  ? GFRAA 109 04/21/2021 0906  ? ? ?Lipid Panel  ?   ?Component Value Date/Time  ? CHOL 212 (H) 05/25/2018 1053  ? TRIG 382 (H) 05/25/2018 1053  ? HDL 41 05/25/2018 1053  ? CHOLHDL 5.2 (H) 05/25/2018 1053  ? LDLCALC 116 (H) 05/25/2018 1053  ? ? ?CBC ?   ?Component Value Date/Time  ? WBC 6.7 01/31/2019 1058  ? RBC 4.72 01/31/2019 1058  ? HGB 14.5 01/31/2019 1058  ? HGB 14.3 04/27/2018 0714  ? HCT 41.3 01/31/2019 1058  ? HCT 41.0 04/27/2018 0714  ? PLT 251 01/31/2019 1058  ? PLT 212 04/27/2018 0714  ? MCV 87.5 01/31/2019 1058  ? MCV 88 04/27/2018 0714  ? MCH 30.7 01/31/2019 1058  ? MCHC 35.1 01/31/2019 1058  ? RDW 12.4 01/31/2019 1058  ? RDW 13.2 04/27/2018 0714  ? LYMPHSABS 1,729 01/31/2019 1058  ? LYMPHSABS 1.6 04/27/2018 0714  ? EOSABS 221 01/31/2019 1058  ? EOSABS 0.3 04/27/2018 0714  ? BASOSABS 60 01/31/2019 1058  ? BASOSABS 0.0 04/27/2018 0714  ? ? ?Hgb A1C ?Lab Results  ?Component Value Date  ? HGBA1C 5.6 05/25/2018  ? ? ? ? ? ? ?   ?Assessment & Plan:  ? ? ? ?Nicki Reaper, NP ? ?

## 2022-03-07 DIAGNOSIS — E1165 Type 2 diabetes mellitus with hyperglycemia: Secondary | ICD-10-CM | POA: Insufficient documentation

## 2022-03-07 DIAGNOSIS — K219 Gastro-esophageal reflux disease without esophagitis: Secondary | ICD-10-CM | POA: Insufficient documentation

## 2022-03-07 NOTE — Assessment & Plan Note (Signed)
Pain controlled without medication at this time ?

## 2022-03-07 NOTE — Assessment & Plan Note (Signed)
He is not currently taking Omeprazole ?

## 2022-03-07 NOTE — Assessment & Plan Note (Signed)
Encourage diet and exercise for weight loss 

## 2022-03-07 NOTE — Assessment & Plan Note (Signed)
New onset ?Discussed diabetes and standards of medical care ?Referral to diabetes education and nutrition placed ?Encouraged him to consume a low-carb diet and exercise for weight loss ?We will start glipizide 10 mg twice daily ?Encourage routine eye exam ?Encourage routine foot exam ?Discussed immunizations at next visit ?

## 2022-03-07 NOTE — Assessment & Plan Note (Signed)
Encouraged him to consume a low-fat diet ?He is not taking Atorvastatin as prescribed ?Advised him to start taking a baby Aspirin ?

## 2022-03-07 NOTE — Assessment & Plan Note (Signed)
He is not taking Atorvastatin as prescribed ?Encouraged to send a low-fat diet ?

## 2022-03-07 NOTE — Assessment & Plan Note (Addendum)
Encouraged him to consume low-fat diet ?He is not taking Atorvastatin as prescribed ?Advised him to start taking a baby Aspirin daily ?

## 2022-03-09 NOTE — Patient Instructions (Signed)

## 2022-04-13 ENCOUNTER — Encounter: Payer: Self-pay | Admitting: Dietician

## 2022-04-13 ENCOUNTER — Encounter: Payer: Managed Care, Other (non HMO) | Attending: Internal Medicine | Admitting: Dietician

## 2022-04-13 VITALS — Ht 73.0 in | Wt 204.9 lb

## 2022-04-13 DIAGNOSIS — E785 Hyperlipidemia, unspecified: Secondary | ICD-10-CM | POA: Diagnosis not present

## 2022-04-13 DIAGNOSIS — Z713 Dietary counseling and surveillance: Secondary | ICD-10-CM | POA: Insufficient documentation

## 2022-04-13 DIAGNOSIS — M109 Gout, unspecified: Secondary | ICD-10-CM | POA: Insufficient documentation

## 2022-04-13 DIAGNOSIS — N200 Calculus of kidney: Secondary | ICD-10-CM | POA: Insufficient documentation

## 2022-04-13 DIAGNOSIS — Z87442 Personal history of urinary calculi: Secondary | ICD-10-CM

## 2022-04-13 DIAGNOSIS — E1165 Type 2 diabetes mellitus with hyperglycemia: Secondary | ICD-10-CM | POA: Insufficient documentation

## 2022-04-13 NOTE — Patient Instructions (Addendum)
Control portions of starches to help with blood sugar control. Choose whole grains when possible.  Keep added sugars to under 10g per serving for most foods.  Eat a breakfast daily to help with blood sugar control and reduce risk for kidney stones. Limit or avoid high purine foods to prevent uric acid buildup for gout and kidney stones. Include daily movement as much as able to help blood sugar control, cholesterol.

## 2022-04-13 NOTE — Progress Notes (Signed)
Medical Nutrition Therapy: Visit start time: 1045  end time: 1145  Assessment:  Diagnosis: Type 2 DM Past medical history: kidney stones, gout, hyperlipidemia Psychosocial issues/ stress concerns: none  Preferred learning method:  Auditory Visual Hands-on  Current weight: 204.9lbs with shoes  Height: 6'1" BMI: 27.03  Medications, supplements: reconciled list in medical record  Progress and evaluation:  Patient reports he has reduced sugar intake, mostly from beverages; does not eat many sweets.He voices confustion with what he can eat when dealing with risk for kidney stones, gout flare-ups, and diabetes.  He is unable to recall the type of kidney stones he has had; reports he has been told to avoid/ limit most meats (usual recommendation for uric acid, cysteine stones) He has decreased starches, choosing more whole grains Dad had diabetes and heart disease starting in 71s.  Uric acid within normal range at 5.2 on 02/03/22 Reports HbA1C of 10.6% Tests BGs daily, 140 fasting today; has had some in 70s. Highest reading recently was 180.  Has lost about 10lbs in past few years due to diet changes.   Physical activity: calisthenics, weights, cycling -- maybe once a week, sporadically  Dietary Intake:  Usual eating pattern includes 3 meals and ? snacks per day. Dining out frequency: 7 meals per week.  Breakfast: skips or eats small, quick meal Snack:  Lunch:  Snack:  Supper: fish, chicken, occasional red meat + vegetables + starch ie whole wheat pasta Snack:  Beverages: water, diet soda, 1-2 beer (light)  Intervention:   Nutrition Care Education:   Basic nutrition: basic food groups; appropriate nutrient balance; appropriate meal and snack schedule; general nutrition guidelines    Advanced nutrition:  food label reading for carbohydrate, added sugars Diabetes:  appropriate meal and snack schedule, appropriate carb intake and balance, healthy carb choices, role of fiber, protein,  fat; role of physical activity Kidney stones: limiting high purine meats/ animal proteins;  Gout: limiting meat portions and frequency of high purine foods; including starches with each meal/ snack; importance of adequate fluid intake; limiting alcohol intake; choosing low fat foods   Nutritional Diagnosis:  P[place]le-2.1 Inpaired nutrition utilization and Long-2.2 Altered nutrition-related laboratory As related to history of kidney stones, gout, type 2 diabetes.  As evidenced by surgical removal of kidney stone and history of passing multiple stones, history of elevated uric acid and gout related pain; elevated HbA1C.   Education Materials given:  Guidelines for Gout Purine table and information Kidney Stones Nutrition Therapy (NCM) Plate Planner with food lists, sample meal pattern Sample menus Visit summary with goals/ instructions   Learner/ who was taught:  Patient    Level of understanding: Verbalizes/ demonstrates competency   Demonstrated degree of understanding via:   Teach back Learning barriers: None  Willingness to learn/ readiness for change: Eager, change in progress   Monitoring and Evaluation:  Dietary intake, exercise, BG control, and body weight      follow up:  07/13/22 at 10:45am

## 2022-04-22 ENCOUNTER — Telehealth: Payer: Self-pay | Admitting: Dietician

## 2022-04-22 NOTE — Telephone Encounter (Signed)
Contacted patient to reschedule MNT follow up from 07/13/22; he will now return on Monday 07/06/22 at 11:30am.

## 2022-06-03 ENCOUNTER — Other Ambulatory Visit: Payer: Self-pay | Admitting: Internal Medicine

## 2022-06-03 NOTE — Telephone Encounter (Signed)
Requested Prescriptions  Pending Prescriptions Disp Refills  . glipiZIDE (GLUCOTROL) 10 MG tablet [Pharmacy Med Name: GLIPIZIDE 10MG  TABLETS] 180 tablet 0    Sig: TAKE 1/2 TABLET(5 MG) BY MOUTH TWICE DAILY BEFORE A MEAL FOR 10 DAYS THEN TAKE 1 TABLET(10 MG) BY MOUTH TWICE DAILY BEFORE A MEAL     Endocrinology:  Diabetes - Sulfonylureas Failed - 06/03/2022  3:32 AM      Failed - HBA1C is between 0 and 7.9 and within 180 days    Hgb A1c MFr Bld  Date Value Ref Range Status  05/25/2018 5.6 <5.7 % of total Hgb Final    Comment:    For the purpose of screening for the presence of diabetes: . <5.7%       Consistent with the absence of diabetes 5.7-6.4%    Consistent with increased risk for diabetes             (prediabetes) > or =6.5%  Consistent with diabetes . This assay result is consistent with a decreased risk of diabetes. . Currently, no consensus exists regarding use of hemoglobin A1c for diagnosis of diabetes in children. . According to American Diabetes Association (ADA) guidelines, hemoglobin A1c <7.0% represents optimal control in non-pregnant diabetic patients. Different metrics may apply to specific patient populations.  Standards of Medical Care in Diabetes(ADA). .          Failed - Cr in normal range and within 360 days    Creat  Date Value Ref Range Status  04/21/2021 0.90 0.70 - 1.33 mg/dL Final    Comment:    For patients >68 years of age, the reference limit for Creatinine is approximately 13% higher for people identified as African-American. 54 - Valid encounter within last 6 months    Recent Outpatient Visits          2 months ago Type 2 diabetes mellitus with hyperglycemia, without long-term current use of insulin [facility])   [facility] [place] Gonzalez, Mullins W, NP   5 months ago Acute gout of right ankle, unspecified cause   [facility] [place], [place], DO   10 months ago Acute idiopathic gout of left  foot   [facility] [place], [place], NP   11 months ago Chronic neck pain   [facility] [place], Mullins, NP   1 year ago Encounter for general adult medical examination with abnormal findings   [facility], [facility], NP      Future Appointments            In 1 week Salvadore Oxford, Sampson Si, NP [facility] [place], [facility]

## 2022-06-11 ENCOUNTER — Ambulatory Visit: Payer: Managed Care, Other (non HMO) | Admitting: Internal Medicine

## 2022-06-12 ENCOUNTER — Ambulatory Visit: Payer: Managed Care, Other (non HMO) | Admitting: Internal Medicine

## 2022-06-25 ENCOUNTER — Encounter: Payer: Self-pay | Admitting: Internal Medicine

## 2022-06-25 ENCOUNTER — Ambulatory Visit (INDEPENDENT_AMBULATORY_CARE_PROVIDER_SITE_OTHER): Payer: Managed Care, Other (non HMO) | Admitting: Internal Medicine

## 2022-06-25 VITALS — BP 126/78 | HR 77 | Temp 97.1°F | Ht 73.0 in | Wt 208.0 lb

## 2022-06-25 DIAGNOSIS — Z125 Encounter for screening for malignant neoplasm of prostate: Secondary | ICD-10-CM

## 2022-06-25 DIAGNOSIS — Z0001 Encounter for general adult medical examination with abnormal findings: Secondary | ICD-10-CM

## 2022-06-25 DIAGNOSIS — E663 Overweight: Secondary | ICD-10-CM

## 2022-06-25 DIAGNOSIS — E1165 Type 2 diabetes mellitus with hyperglycemia: Secondary | ICD-10-CM | POA: Diagnosis not present

## 2022-06-25 DIAGNOSIS — Z23 Encounter for immunization: Secondary | ICD-10-CM

## 2022-06-25 DIAGNOSIS — Z6827 Body mass index (BMI) 27.0-27.9, adult: Secondary | ICD-10-CM

## 2022-06-25 NOTE — Addendum Note (Signed)
Addended by: Lorre Munroe on: 06/25/2022 09:31 AM   Modules accepted: Orders

## 2022-06-25 NOTE — Assessment & Plan Note (Signed)
Encouraged diet and exercise for weight loss ?

## 2022-06-25 NOTE — Progress Notes (Signed)
Subjective:    Patient ID: Eric Hogan, male    DOB: April 06, 1961, 61 y.o.   MRN: [id number]  HPI  Patient presents to clinic today for his annual exam.  Flu: 07/2021 Tetanus: 07/2015 COVID: pfizer x 2 Pneumovax: Never Shingrix: Never PSA screening: 04/2018 Colon screening: 07/2016, scheduled 07/2022 Vision screening: annually Dentist: biannually  Diet: He does eat meat. He consumes more veggies than fruits. He tries to avoid fried foods. He drinks mostly water, coffee. Exercise:  body weight exercises  Review of Systems     Past Medical History:  Diagnosis Date   Gout    Hiatal hernia    Polyp of colon     Current Outpatient Medications  Medication Sig Dispense Refill   atorvastatin (LIPITOR) 20 MG tablet Take 20 mg by mouth daily.     Calcium Polycarbophil (FIBER-CAPS PO) Take by mouth.     colchicine 0.6 MG tablet At start of acute flare, take 2 pills (1.57m) then repeat 1 pill (0.652m 1 hour later, then take 1 pill daily for up to 7-10 days, can increase to twice daily if need. 30 tablet 2   febuxostat (ULORIC) 40 MG tablet Take by mouth.     glipiZIDE (GLUCOTROL) 10 MG tablet TAKE 1/2 TABLET(5 MG) BY MOUTH TWICE DAILY BEFORE A MEAL FOR 10 DAYS THEN TAKE 1 TABLET(10 MG) BY MOUTH TWICE DAILY BEFORE A MEAL 180 tablet 0   Omega-3 1000 MG CAPS Take by mouth.     Potassium Citrate 15 MEQ (1620 MG) TBCR Take by mouth.     TART CHERRY PO Take by mouth. Takes gummies     No current facility-administered medications for this visit.    Allergies  Allergen Reactions   Aspirin Hives and Swelling   Ibuprofen Swelling    Family History  Problem Relation Age of Onset   Hyperlipidemia Mother    Diabetes Father    Heart disease Father     Social History   Socioeconomic History   Marital status: Divorced    Spouse name: Not on file   Number of children: Not on file   Years of education: Not on file   Highest education level: Not on file  Occupational History    Not on file  Tobacco Use   Smoking status: Former    Types: Cigarettes    Quit date: 02/20/2018    Years since quitting: 4.3   Smokeless tobacco: Never   Tobacco comments:    Short term a few times over many years. Did smoke for a time during pandemic  Vaping Use   Vaping Use: Never used  Substance and Sexual Activity   Alcohol use: Yes    Alcohol/week: 12.0 standard drinks of alcohol    Types: 12 Standard drinks or equivalent per week   Drug use: No   Sexual activity: Not on file  Other Topics Concern   Not on file  Social History Narrative   Not on file   Social Determinants of Health   Financial Resource Strain: Not on file  Food Insecurity: Not on file  Transportation Needs: Not on file  Physical Activity: Not on file  Stress: Not on file  Social Connections: Not on file  Intimate Partner Violence: Not on file     Constitutional: Denies fever, malaise, fatigue, headache or abrupt weight changes.  HEENT: Denies eye pain, eye redness, ear pain, ringing in the ears, wax buildup, runny nose, nasal congestion, bloody nose, or sore throat.  Respiratory: Denies difficulty breathing, shortness of breath, cough or sputum production.   Cardiovascular: Denies chest pain, chest tightness, palpitations or swelling in the hands or feet.  Gastrointestinal: Denies abdominal pain, bloating, constipation, diarrhea or blood in the stool.  GU: Denies urgency, frequency, pain with urination, burning sensation, blood in urine, odor or discharge. Musculoskeletal: Pt reports intermittent ankle pain. Denies decrease in range of motion, difficulty with gait, muscle pain or joint swelling.  Skin: Denies redness, rashes, lesions or ulcercations.  Neurological: Denies dizziness, difficulty with memory, difficulty with speech or problems with balance and coordination.  Psych: Denies anxiety, depression, SI/HI.  No other specific complaints in a complete review of systems (except as listed in HPI  above).  Objective:   Physical Exam   BP 126/78 (BP Location: Right Arm, Patient Position: Sitting, Cuff Size: Normal)   Pulse 77   Temp (!) 97.1 F (36.2 C) (Temporal)   Ht _0  (1.854 m)   Wt 208 lb (94.3 kg)   SpO2 99%   BMI 27.44 kg/m   Wt Readings from Last 3 Encounters:  04/13/22 204 lb 14.4 oz (92.9 kg)  03/06/22 204 lb (92.5 kg)  12/24/21 211 lb 3.2 oz (95.8 kg)    General: Appears his stated age, overweight, in NAD. Skin: Warm, dry and intact. No ulcerations noted. HEENT: Head: normal shape and size; Eyes: sclera white, no icterus, conjunctiva pink, PERRLA and EOMs intact; Neck:  Neck supple, trachea midline. No masses, lumps or thyromegaly present.  Cardiovascular: Normal rate and rhythm. S1,S2 noted.  No murmur, rubs or gallops noted. No JVD or BLE edema. No carotid bruits noted. Pulmonary/Chest: Normal effort and positive vesicular breath sounds. No respiratory distress. No wheezes, rales or ronchi noted.  Abdomen: Normal bowel sounds. Musculoskeletal: Strength 5/5 BUE/BLE.  No difficulty with gait.  Neurological: Alert and oriented. Cranial nerves II-XII grossly intact. Coordination normal.  Psychiatric: Mood and affect normal. Behavior is normal. Judgment and thought content normal.    BMET    Component Value Date/Time   NA 140 04/21/2021 0906   NA 140 04/27/2018 0714   K 4.4 04/21/2021 0906   CL 104 04/21/2021 0906   CO2 26 04/21/2021 0906   GLUCOSE 118 (H) 04/21/2021 0906   BUN 11 04/21/2021 0906   BUN 13 04/27/2018 0714   CREATININE 0.90 04/21/2021 0906   CALCIUM 9.3 04/21/2021 0906   GFRNONAA 94 04/21/2021 0906   GFRAA 109 04/21/2021 0906    Lipid Panel     Component Value Date/Time   CHOL 212 (H) 05/25/2018 1053   TRIG 382 (H) 05/25/2018 1053   HDL 41 05/25/2018 1053   CHOLHDL 5.2 (H) 05/25/2018 1053   LDLCALC 116 (H) 05/25/2018 1053    CBC    Component Value Date/Time   WBC 6.7 01/31/2019 1058   RBC 4.72 01/31/2019 1058   HGB 14.5  01/31/2019 1058   HGB 14.3 04/27/2018 0714   HCT 41.3 01/31/2019 1058   HCT 41.0 04/27/2018 0714   PLT 251 01/31/2019 1058   PLT 212 04/27/2018 0714   MCV 87.5 01/31/2019 1058   MCV 88 04/27/2018 0714   MCH 30.7 01/31/2019 1058   MCHC 35.1 01/31/2019 1058   RDW 12.4 01/31/2019 1058   RDW 13.2 04/27/2018 0714   LYMPHSABS 1,729 01/31/2019 1058   LYMPHSABS 1.6 04/27/2018 0714   EOSABS 221 01/31/2019 1058   EOSABS 0.3 04/27/2018 0714   BASOSABS 60 01/31/2019 1058   BASOSABS 0.0 04/27/2018 0714  Hgb A1C Lab Results  Component Value Date   HGBA1C 5.6 05/25/2018           Assessment & Plan:   Preventative Health Maintenance:  Flu shot today Tetanus UTD Encouraged him to get a COVID-vaccine He declines Pneumovax today Discussed Shingrix vaccine, he will check coverage with his insurance company and schedule a nurse visit if he would like to have this done Colon screening UTD Encouraged him to consume a balanced diet and exercise regimen Advised him to see an eye doctor and dentist annually We will check CBC, c-Met, lipid, A1c, urine microalbumin and PSA today  RTC in 6 months, follow-up chronic conditions Webb Silversmith, NP

## 2022-06-25 NOTE — Patient Instructions (Signed)
Health Maintenance, Male Adopting a healthy lifestyle and getting preventive care are important in promoting health and wellness. Ask your health care provider about: The right schedule for you to have regular tests and exams. Things you can do on your own to prevent diseases and keep yourself healthy. What should I know about diet, weight, and exercise? Eat a healthy diet  Eat a diet that includes plenty of vegetables, fruits, low-fat dairy products, and lean protein. Do not eat a lot of foods that are high in solid fats, added sugars, or sodium. Maintain a healthy weight Body mass index (BMI) is a measurement that can be used to identify possible weight problems. It estimates body fat based on height and weight. Your health care provider can help determine your BMI and help you achieve or maintain a healthy weight. Get regular exercise Get regular exercise. This is one of the most important things you can do for your health. Most adults should: Exercise for at least 150 minutes each week. The exercise should increase your heart rate and make you sweat (moderate-intensity exercise). Do strengthening exercises at least twice a week. This is in addition to the moderate-intensity exercise. Spend less time sitting. Even light physical activity can be beneficial. Watch cholesterol and blood lipids Have your blood tested for lipids and cholesterol at 59 years of age, then have this test every 5 years. You may need to have your cholesterol levels checked more often if: Your lipid or cholesterol levels are high. You are older than 59 years of age. You are at high risk for heart disease. What should I know about cancer screening? Many types of cancers can be detected early and may often be prevented. Depending on your health history and family history, you may need to have cancer screening at various ages. This may include screening for: Colorectal cancer. Prostate cancer. Skin cancer. Lung  cancer. What should I know about heart disease, diabetes, and high blood pressure? Blood pressure and heart disease High blood pressure causes heart disease and increases the risk of stroke. This is more likely to develop in people who have high blood pressure readings or are overweight. Talk with your health care provider about your target blood pressure readings. Have your blood pressure checked: Every 3-5 years if you are 18-39 years of age. Every year if you are 40 years old or older. If you are between the ages of 65 and 75 and are a current or former smoker, ask your health care provider if you should have a one-time screening for abdominal aortic aneurysm (AAA). Diabetes Have regular diabetes screenings. This checks your fasting blood sugar level. Have the screening done: Once every three years after age 45 if you are at a normal weight and have a low risk for diabetes. More often and at a younger age if you are overweight or have a high risk for diabetes. What should I know about preventing infection? Hepatitis B If you have a higher risk for hepatitis B, you should be screened for this virus. Talk with your health care provider to find out if you are at risk for hepatitis B infection. Hepatitis C Blood testing is recommended for: Everyone born from 1945 through 1965. Anyone with known risk factors for hepatitis C. Sexually transmitted infections (STIs) You should be screened each year for STIs, including gonorrhea and chlamydia, if: You are sexually active and are younger than 59 years of age. You are older than 59 years of age and your   health care provider tells you that you are at risk for this type of infection. Your sexual activity has changed since you were last screened, and you are at increased risk for chlamydia or gonorrhea. Ask your health care provider if you are at risk. Ask your health care provider about whether you are at high risk for HIV. Your health care provider  may recommend a prescription medicine to help prevent HIV infection. If you choose to take medicine to prevent HIV, you should first get tested for HIV. You should then be tested every 3 months for as long as you are taking the medicine. Follow these instructions at home: Alcohol use Do not drink alcohol if your health care provider tells you not to drink. If you drink alcohol: Limit how much you have to 0-2 drinks a day. Know how much alcohol is in your drink. In the [place] one drink equals one 12 oz bottle of beer (355 mL), one 5 oz glass of wine (148 mL), or one 1 oz glass of hard liquor (44 mL). Lifestyle Do not use any products that contain nicotine or tobacco. These products include cigarettes, chewing tobacco, and vaping devices, such as e-cigarettes. If you need help quitting, ask your health care provider. Do not use street drugs. Do not share needles. Ask your health care provider for help if you need support or information about quitting drugs. General instructions Schedule regular health, dental, and eye exams. Stay current with your vaccines. Tell your health care provider if: You often feel depressed. You have ever been abused or do not feel safe at home. Summary Adopting a healthy lifestyle and getting preventive care are important in promoting health and wellness. Follow your health care provider's instructions about healthy diet, exercising, and getting tested or screened for diseases. Follow your health care provider's instructions on monitoring your cholesterol and blood pressure. This information is not intended to replace advice given to you by your health care provider. Make sure you discuss any questions you have with your health care provider. Document Revised: 03/03/2021 Document Reviewed: 03/03/2021 Elsevier Patient Education  2023 Elsevier Inc.  

## 2022-06-26 LAB — COMPLETE METABOLIC PANEL WITH GFR
AG Ratio: 1.9 (calc) (ref 1.0–2.5)
ALT: 36 U/L (ref 9–46)
AST: 24 U/L (ref 10–35)
Albumin: 4.7 g/dL (ref 3.6–5.1)
Alkaline phosphatase (APISO): 74 U/L (ref 35–144)
BUN: 17 mg/dL (ref 7–25)
CO2: 22 mmol/L (ref 20–32)
Calcium: 9.6 mg/dL (ref 8.6–10.3)
Chloride: 102 mmol/L (ref 98–110)
Creat: 0.9 mg/dL (ref 0.70–1.30)
Globulin: 2.5 g/dL (calc) (ref 1.9–3.7)
Glucose, Bld: 125 mg/dL — ABNORMAL HIGH (ref 65–99)
Potassium: 4.5 mmol/L (ref 3.5–5.3)
Sodium: 137 mmol/L (ref 135–146)
Total Bilirubin: 0.5 mg/dL (ref 0.2–1.2)
Total Protein: 7.2 g/dL (ref 6.1–8.1)
eGFR: 98 mL/min/{1.73_m2} (ref >=60)

## 2022-06-26 LAB — CBC
HCT: 43.5 % (ref 38.5–50.0)
Hemoglobin: 15.3 g/dL (ref 13.2–17.1)
MCH: 31 pg (ref 27.0–33.0)
MCHC: 35.2 g/dL (ref 32.0–36.0)
MCV: 88.1 fL (ref 80.0–100.0)
MPV: 12.2 fL (ref 7.5–12.5)
Platelets: 189 10*3/uL (ref 140–400)
RBC: 4.94 10*6/uL (ref 4.20–5.80)
RDW: 12.5 % (ref 11.0–15.0)
WBC: 6 10*3/uL (ref 3.8–10.8)

## 2022-06-26 LAB — MICROALBUMIN / CREATININE URINE RATIO
Creatinine, Urine: 158 mg/dL (ref 20–320)
Microalb Creat Ratio: 3 mcg/mg creat (ref <30)
Microalb, Ur: 0.5 mg/dL

## 2022-06-26 LAB — HEMOGLOBIN A1C
Hgb A1c MFr Bld: 5.9 % of total Hgb — ABNORMAL HIGH (ref <5.7)
Mean Plasma Glucose: 123 mg/dL
eAG (mmol/L): 6.8 mmol/L

## 2022-06-26 LAB — LIPID PANEL
Cholesterol: 245 mg/dL — ABNORMAL HIGH (ref <200)
HDL: 40 mg/dL (ref >=40)
Non-HDL Cholesterol (Calc): 205 mg/dL (calc) — ABNORMAL HIGH (ref <130)
Total CHOL/HDL Ratio: 6.1 (calc) — ABNORMAL HIGH (ref <5.0)
Triglycerides: 755 mg/dL — ABNORMAL HIGH (ref <150)

## 2022-06-26 LAB — PSA: PSA: 0.89 ng/mL (ref <=4.00)

## 2022-07-02 MED ORDER — FENOFIBRATE 145 MG PO TABS: 145.0000 mg | ORAL_TABLET | Freq: Every day | ORAL | 1 refills | Status: DC

## 2022-07-02 NOTE — Addendum Note (Signed)
Addended by: Lorre Munroe on: 07/02/2022 07:24 AM   Modules accepted: Orders

## 2022-07-06 ENCOUNTER — Ambulatory Visit: Payer: Managed Care, Other (non HMO) | Admitting: Dietician

## 2022-08-05 ENCOUNTER — Encounter: Payer: Self-pay | Admitting: Dietician

## 2022-08-05 NOTE — Progress Notes (Signed)
Have not heard back from patient to reschedule his cancelled appointment from 07/06/22. Sent notification to referring provider.

## 2022-09-10 LAB — HM COLONOSCOPY

## 2023-01-28 ENCOUNTER — Ambulatory Visit: Payer: Managed Care, Other (non HMO) | Admitting: Internal Medicine

## 2023-02-08 ENCOUNTER — Ambulatory Visit (INDEPENDENT_AMBULATORY_CARE_PROVIDER_SITE_OTHER): Payer: Managed Care, Other (non HMO) | Admitting: Internal Medicine

## 2023-02-08 ENCOUNTER — Encounter: Payer: Self-pay | Admitting: Internal Medicine

## 2023-02-08 VITALS — BP 122/80 | HR 64 | Temp 97.1°F | Wt 215.0 lb

## 2023-02-08 DIAGNOSIS — K219 Gastro-esophageal reflux disease without esophagitis: Secondary | ICD-10-CM

## 2023-02-08 DIAGNOSIS — E782 Mixed hyperlipidemia: Secondary | ICD-10-CM

## 2023-02-08 DIAGNOSIS — E1165 Type 2 diabetes mellitus with hyperglycemia: Secondary | ICD-10-CM | POA: Diagnosis not present

## 2023-02-08 DIAGNOSIS — M1A0710 Idiopathic chronic gout, right ankle and foot, without tophus (tophi): Secondary | ICD-10-CM

## 2023-02-08 DIAGNOSIS — I700 Atherosclerosis of aorta: Secondary | ICD-10-CM

## 2023-02-08 DIAGNOSIS — I2510 Atherosclerotic heart disease of native coronary artery without angina pectoris: Secondary | ICD-10-CM

## 2023-02-08 DIAGNOSIS — M109 Gout, unspecified: Secondary | ICD-10-CM | POA: Insufficient documentation

## 2023-02-08 DIAGNOSIS — E663 Overweight: Secondary | ICD-10-CM

## 2023-02-08 DIAGNOSIS — Z6828 Body mass index (BMI) 28.0-28.9, adult: Secondary | ICD-10-CM

## 2023-02-08 DIAGNOSIS — M353 Polymyalgia rheumatica: Secondary | ICD-10-CM

## 2023-02-08 LAB — POCT GLYCOSYLATED HEMOGLOBIN (HGB A1C): HbA1c, POC (controlled diabetic range): 6.5 % (ref 0.0–7.0)

## 2023-02-08 NOTE — Assessment & Plan Note (Signed)
Try to avoid food or drink that causes reflux Encourage weight loss as this can help reduce reflux symptoms

## 2023-02-08 NOTE — Patient Instructions (Signed)

## 2023-02-08 NOTE — Progress Notes (Signed)
Subjective:    Patient ID: Eric Hogan, male    DOB: May 03, 1961, 62 y.o.   MRN: [id number]  HPI  Patient presents to clinic today for 43-month follow-up of chronic conditions.  PMR: He denies pain at this time. He is not currently taking any medications for this.  He follows with rheumatology.  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  He is not currently taking any medications for this.  There is no upper GI on file.  HLD with CAD, Aortic Atherosclerosis: His last LDL was not calculated, triglycerides 755, 05/2022.  He is not taking Fenofibrate as prescribed. He is allergic Aspirin.  He tries to consume low-fat diet.  DM 2: His last A1c was 5.9%, 05/2022.  He is not taking any oral diabetic medication at this time. He checks his sugars intermittently. His last eye exam was within the last year, [facility].  He does not check his feet routinely.  Flu 05/2022.  Pneumovax never.  COVID never.  Gout: Mainly in his right foot. He does not take any medication for this. He follows with rheumatology.  Review of Systems     Past Medical History:  Diagnosis Date   Gout    Hiatal hernia    Polyp of colon     Current Outpatient Medications  Medication Sig Dispense Refill   Calcium Polycarbophil (FIBER-CAPS PO) Take by mouth.     colchicine 0.6 MG tablet At start of acute flare, take 2 pills (1.2mg ) then repeat 1 pill (0.6mg ) 1 hour later, then take 1 pill daily for up to 7-10 days, can increase to twice daily if need. 30 tablet 2   febuxostat (ULORIC) 40 MG tablet Take by mouth.     fenofibrate (TRICOR) 145 MG tablet Take 1 tablet (145 mg total) by mouth daily. 90 tablet 1   Omega-3 1000 MG CAPS Take by mouth.     Potassium Citrate 15 MEQ (1620 MG) TBCR Take by mouth.     TART CHERRY PO Take by mouth. Takes gummies     No current facility-administered medications for this visit.    Allergies  Allergen Reactions   Aspirin Hives and Swelling   Ibuprofen Swelling    Family History  Problem  Relation Age of Onset   Hyperlipidemia Mother    Diabetes Father    Heart disease Father     Social History   Socioeconomic History   Marital status: Divorced    Spouse name: Not on file   Number of children: Not on file   Years of education: Not on file   Highest education level: Not on file  Occupational History   Not on file  Tobacco Use   Smoking status: Former    Types: Cigarettes    Quit date: 02/20/2018    Years since quitting: 4.9   Smokeless tobacco: Never   Tobacco comments:    Short term a few times over many years. Did smoke for a time during pandemic  Vaping Use   Vaping Use: Never used  Substance and Sexual Activity   Alcohol use: Yes    Alcohol/week: 12.0 standard drinks of alcohol    Types: 12 Standard drinks or equivalent per week   Drug use: No   Sexual activity: Not on file  Other Topics Concern   Not on file  Social History Narrative   Not on file   Social Determinants of Health   Financial Resource Strain: Not on file  Food Insecurity: Not on file  Transportation Needs: Not on file  Physical Activity: Not on file  Stress: Not on file  Social Connections: Not on file  Intimate Partner Violence: Not on file     Constitutional: Denies fever, malaise, fatigue, headache or abrupt weight changes.  HEENT: Denies eye pain, eye redness, ear pain, ringing in the ears, wax buildup, runny nose, nasal congestion, bloody nose, or sore throat. Respiratory: Denies difficulty breathing, shortness of breath, cough or sputum production.   Cardiovascular: Denies chest pain, chest tightness, palpitations or swelling in the hands or feet.  Gastrointestinal: Patient reports intermittent reflux.  Denies abdominal pain, bloating, constipation, diarrhea or blood in the stool.  GU: Denies urgency, frequency, pain with urination, burning sensation, blood in urine, odor or discharge. Musculoskeletal: Denies decrease in range of motion, difficulty with gait, muscle pain  or joint pain and swelling.  Skin: Denies redness, rashes, lesions or ulcercations.  Neurological: Denies dizziness, difficulty with memory, difficulty with speech or problems with balance and coordination.  Psych: Denies anxiety, depression, SI/HI.  No other specific complaints in a complete review of systems (except as listed in HPI above).  Objective:   Physical Exam   BP 122/80 (BP Location: Left Arm, Patient Position: Sitting, Cuff Size: Normal)   Pulse 64   Temp (!) 97.1 F (36.2 C) (Temporal)   Wt 215 lb (97.5 kg)   SpO2 98%   BMI 28.37 kg/m   Wt Readings from Last 3 Encounters:  06/25/22 208 lb (94.3 kg)  04/13/22 204 lb 14.4 oz (92.9 kg)  03/06/22 204 lb (92.5 kg)    General: Appears his stated age, overweight, in NAD. Skin: Warm, dry and intact. No ulcerations noted. HEENT: Head: normal shape and size; Eyes: sclera white, no icterus, conjunctiva pink, PERRLA and EOMs intact;  Cardiovascular: Normal rate and rhythm. S1,S2 noted.  No murmur, rubs or gallops noted. No JVD or BLE edema. No carotid bruits noted. Pulmonary/Chest: Normal effort and positive vesicular breath sounds. No respiratory distress. No wheezes, rales or ronchi noted.  Abdomen: Normal bowel sounds.  Musculoskeletal: No signs of joint swelling. No difficulty with gait.  Neurological: Alert and oriented.  Coordination normal.      BMET    Component Value Date/Time   NA 137 06/25/2022 0929   NA 140 04/27/2018 0714   K 4.5 06/25/2022 0929   CL 102 06/25/2022 0929   CO2 22 06/25/2022 0929   GLUCOSE 125 (H) 06/25/2022 0929   BUN 17 06/25/2022 0929   BUN 13 04/27/2018 0714   CREATININE 0.90 06/25/2022 0929   CALCIUM 9.6 06/25/2022 0929   GFRNONAA 94 04/21/2021 0906   GFRAA 109 04/21/2021 0906    Lipid Panel     Component Value Date/Time   CHOL 245 (H) 06/25/2022 0929   TRIG 755 (H) 06/25/2022 0929   HDL 40 06/25/2022 0929   CHOLHDL 6.1 (H) 06/25/2022 0929   LDLCALC  06/25/2022 0929      Comment:     . LDL cholesterol not calculated. Triglyceride levels greater than 400 mg/dL invalidate calculated LDL results. . Reference range: <100 . Desirable range <100 mg/dL for primary prevention;   <70 mg/dL for patients with CHD or diabetic patients  with > or = 2 CHD risk factors. Marland Kitchen LDL-C is now calculated using the Martin-Hopkins  calculation, which is a validated novel method providing  better accuracy than the Friedewald equation in the  estimation of LDL-C.  Horald Pollen et al. Lenox Ahr. 1610;960(45): 2061-2068  (http://education.[facility].com/faq/FAQ164)  CBC    Component Value Date/Time   WBC 6.0 06/25/2022 0929   RBC 4.94 06/25/2022 0929   HGB 15.3 06/25/2022 0929   HGB 14.3 04/27/2018 0714   HCT 43.5 06/25/2022 0929   HCT 41.0 04/27/2018 0714   PLT 189 06/25/2022 0929   PLT 212 04/27/2018 0714   MCV 88.1 06/25/2022 0929   MCV 88 04/27/2018 0714   MCH 31.0 06/25/2022 0929   MCHC 35.2 06/25/2022 0929   RDW 12.5 06/25/2022 0929   RDW 13.2 04/27/2018 0714   LYMPHSABS 1,729 01/31/2019 1058   LYMPHSABS 1.6 04/27/2018 0714   EOSABS 221 01/31/2019 1058   EOSABS 0.3 04/27/2018 0714   BASOSABS 60 01/31/2019 1058   BASOSABS 0.0 04/27/2018 0714    Hgb A1C Lab Results  Component Value Date   HGBA1C 5.9 (H) 06/25/2022          Assessment & Plan:     RTC in 6 months for annual exam Nicki Reaper, NP

## 2023-02-08 NOTE — Assessment & Plan Note (Signed)
Follows with rheumatology. 

## 2023-02-08 NOTE — Assessment & Plan Note (Signed)
C-Met and lipid profile today Encouraged him to continue a low-fat diet We will revisit statin therapy based on labs He is allergic to aspirin 

## 2023-02-08 NOTE — Assessment & Plan Note (Signed)
POCT A1c 6.5% Urine microalbumin has been checked within the last year He is not currently taking any medications for this Encourage low-carb diet and exercise for weight loss Encouraged him to tell his eye doctor that he has diabetes so that they can do a diabetic eye exam Encouraged routine foot exam Encouraged him to get a flu shot in the fall He declines Pneumovax and COVID-vaccine

## 2023-02-08 NOTE — Assessment & Plan Note (Addendum)
Encourage diet and exercise for weight loss 

## 2023-02-08 NOTE — Assessment & Plan Note (Signed)
C-Met and lipid profile today Encouraged him to continue a low-fat diet We will revisit statin therapy based on labs He is allergic to aspirin

## 2023-02-09 LAB — COMPLETE METABOLIC PANEL WITH GFR
AG Ratio: 1.8 (calc) (ref 1.0–2.5)
ALT: 34 U/L (ref 9–46)
AST: 26 U/L (ref 10–35)
Albumin: 4.5 g/dL (ref 3.6–5.1)
Alkaline phosphatase (APISO): 91 U/L (ref 35–144)
BUN: 21 mg/dL (ref 7–25)
CO2: 22 mmol/L (ref 20–32)
Calcium: 9.3 mg/dL (ref 8.6–10.3)
Chloride: 103 mmol/L (ref 98–110)
Creat: 1.07 mg/dL (ref 0.70–1.30)
Globulin: 2.5 g/dL (calc) (ref 1.9–3.7)
Glucose, Bld: 177 mg/dL — ABNORMAL HIGH (ref 65–99)
Potassium: 4.5 mmol/L (ref 3.5–5.3)
Sodium: 140 mmol/L (ref 135–146)
Total Bilirubin: 0.4 mg/dL (ref 0.2–1.2)
Total Protein: 7 g/dL (ref 6.1–8.1)
eGFR: 80 mL/min/{1.73_m2} (ref >=60)

## 2023-02-09 LAB — LIPID PANEL
Cholesterol: 257 mg/dL — ABNORMAL HIGH (ref <200)
HDL: 30 mg/dL — ABNORMAL LOW (ref >=40)
Non-HDL Cholesterol (Calc): 227 mg/dL (calc) — ABNORMAL HIGH (ref <130)
Total CHOL/HDL Ratio: 8.6 (calc) — ABNORMAL HIGH (ref <5.0)
Triglycerides: 2305 mg/dL — ABNORMAL HIGH (ref <150)

## 2023-07-05 ENCOUNTER — Ambulatory Visit: Payer: Self-pay | Admitting: *Deleted

## 2023-07-05 NOTE — Telephone Encounter (Signed)
  Chief Complaint: SOB Symptoms: SOB at HS only. Able to lie flat in bed. No wheezing Frequency: Positive covid 10 days ago. SOB worsening 2 days. Only at HS. Pertinent Negatives: Patient denies wheezing Disposition: [] ED /[] Urgent Care (no appt availability in office) / [x] Appointment(In office/virtual)/ []  [facility]/ [] Home Care/ [] Refused Recommended Disposition /[] [facility]/ []  Follow-up with PCP Additional Notes: Secured appt for Wednesday. Advised ED/UC for worsening symptoms. Care advise provided, pt verbalizes understanding. Reason for Disposition  [1] MODERATE longstanding difficulty breathing (e.g., speaks in phrases, SOB even at rest, pulse 100-120) AND [2] SAME as normal    At HS only  Answer Assessment - Initial Assessment Questions 1. RESPIRATORY STATUS: "Describe your breathing?" (e.g., wheezing, shortness of breath, unable to speak, severe coughing)      SOB at HS only 2. ONSET: "When did this breathing problem begin?"      Worsening S/P covid pos. Positive 10 days ago 3. PATTERN "Does the difficult breathing come and go, or has it been constant since it started?"     Only at HS 4. SEVERITY: "How bad is your breathing?" (e.g., mild, moderate, severe)    - MILD: No SOB at rest, mild SOB with walking, speaks normally in sentences, can lie down, no retractions, pulse < 100.    - MODERATE: SOB at rest, SOB with minimal exertion and prefers to sit, cannot lie down flat, speaks in phrases, mild retractions, audible wheezing, pulse 100-120.    - SEVERE: Very SOB at rest, speaks in single words, struggling to breathe, sitting hunched forward, retractions, pulse > 120      Moderate 5. RECURRENT SYMPTOM: "Have you had difficulty breathing before?" If Yes, ask: "When was the last time?" and "What happened that time?"      No 6. CARDIAC HISTORY: "Do you have any history of heart disease?" (e.g., heart attack, angina, bypass surgery, angioplasty)       7. LUNG  HISTORY: "Do you have any history of lung disease?"  (e.g., pulmonary embolus, asthma, emphysema)      8. CAUSE: "What do you think is causing the breathing problem?"      "Long covid" 9. OTHER SYMPTOMS: "Do you have any other symptoms? (e.g., dizziness, runny nose, cough, chest pain, fever)     Off balance, fatigued  Protocols used: Breathing Difficulty-A-AH

## 2023-07-07 ENCOUNTER — Ambulatory Visit: Payer: Managed Care, Other (non HMO) | Admitting: Internal Medicine

## 2023-08-09 ENCOUNTER — Encounter: Payer: Managed Care, Other (non HMO) | Admitting: Internal Medicine

## 2023-08-12 ENCOUNTER — Ambulatory Visit: Payer: Managed Care, Other (non HMO) | Admitting: Internal Medicine

## 2023-08-12 ENCOUNTER — Encounter: Payer: Self-pay | Admitting: Internal Medicine

## 2023-08-12 VITALS — BP 110/78 | HR 62 | Ht 73.0 in | Wt 214.0 lb

## 2023-08-12 DIAGNOSIS — M1A0710 Idiopathic chronic gout, right ankle and foot, without tophus (tophi): Secondary | ICD-10-CM | POA: Diagnosis not present

## 2023-08-12 DIAGNOSIS — Z0001 Encounter for general adult medical examination with abnormal findings: Secondary | ICD-10-CM | POA: Diagnosis not present

## 2023-08-12 DIAGNOSIS — E1165 Type 2 diabetes mellitus with hyperglycemia: Secondary | ICD-10-CM | POA: Diagnosis not present

## 2023-08-12 DIAGNOSIS — Z125 Encounter for screening for malignant neoplasm of prostate: Secondary | ICD-10-CM

## 2023-08-12 DIAGNOSIS — Z6828 Body mass index (BMI) 28.0-28.9, adult: Secondary | ICD-10-CM

## 2023-08-12 DIAGNOSIS — E663 Overweight: Secondary | ICD-10-CM

## 2023-08-12 LAB — POCT GLYCOSYLATED HEMOGLOBIN (HGB A1C): Hemoglobin A1C: 6.8 % — AB (ref 4.0–5.6)

## 2023-08-12 NOTE — Progress Notes (Signed)
Subjective:    Patient ID: Eric Hogan, male    DOB: 1961/01/30, 62 y.o.   MRN: [id number]  HPI  Patient presents to clinic today for his annual exam.  Flu: 05/2022 Tetanus: 07/2015 COVID: x 2 Shingrix: Never PSA screening: 05/2022 Colon screening: 08/2022 Vision screening: as needed Dentist: biannually  Diet: He does eat meat. He consumes more veggies than fruit. He tries to avoid fried foods. He drinks mostly water, club soda. Exercise: None  Review of Systems     Past Medical History:  Diagnosis Date   Gout    Hiatal hernia    Polyp of colon     Current Outpatient Medications  Medication Sig Dispense Refill   Calcium Polycarbophil (FIBER-CAPS PO) Take by mouth.     colchicine 0.6 MG tablet At start of acute flare, take 2 pills (1.2mg ) then repeat 1 pill (0.6mg ) 1 hour later, then take 1 pill daily for up to 7-10 days, can increase to twice daily if need. 30 tablet 2   Omega-3 1000 MG CAPS Take by mouth.     Potassium Citrate 15 MEQ (1620 MG) TBCR Take by mouth.     TART CHERRY PO Take by mouth. Takes gummies     No current facility-administered medications for this visit.    Allergies  Allergen Reactions   Aspirin Hives and Swelling   Ibuprofen Swelling    Family History  Problem Relation Age of Onset   Hyperlipidemia Mother    Diabetes Father    Heart disease Father     Social History   Socioeconomic History   Marital status: Divorced    Spouse name: Not on file   Number of children: Not on file   Years of education: Not on file   Highest education level: Not on file  Occupational History   Not on file  Tobacco Use   Smoking status: Former    Current packs/day: 0.00    Types: Cigarettes    Quit date: 02/20/2018    Years since quitting: 5.4   Smokeless tobacco: Never   Tobacco comments:    Short term a few times over many years. Did smoke for a time during pandemic  Vaping Use   Vaping status: Never Used  Substance and Sexual Activity    Alcohol use: Yes    Alcohol/week: 12.0 standard drinks of alcohol    Types: 12 Standard drinks or equivalent per week   Drug use: No   Sexual activity: Not on file  Other Topics Concern   Not on file  Social History Narrative   Not on file   Social Determinants of Health   Financial Resource Strain: Not on file  Food Insecurity: Not on file  Transportation Needs: Not on file  Physical Activity: Not on file  Stress: Not on file  Social Connections: Not on file  Intimate Partner Violence: Not on file     Constitutional: Denies fever, malaise, fatigue, headache or abrupt weight changes.  HEENT: Pt reports decreased hearing left ear. Denies eye pain, eye redness, ringing in the ears, wax buildup, runny nose, nasal congestion, bloody nose, or sore throat. Respiratory: Denies difficulty breathing, shortness of breath, cough or sputum production.   Cardiovascular: Denies chest pain, chest tightness, palpitations or swelling in the hands or feet.  Gastrointestinal: Denies abdominal pain, bloating, constipation, diarrhea or blood in the stool.  GU: Denies urgency, frequency, pain with urination, burning sensation, blood in urine, odor or discharge. Musculoskeletal: Patient reports  joint pain.  Denies decrease in range of motion, difficulty with gait, muscle pain or joint swelling.  Skin: Pt reports itching. Denies redness, rashes, lesions or ulcercations.  Neurological: Pt reports intermittent dizziness. Denies difficulty with memory, difficulty with speech or problems with balance and coordination.  Psych: Denies anxiety, depression, SI/HI.  No other specific complaints in a complete review of systems (except as listed in HPI above).  Objective:   Physical Exam  BP 110/78   Pulse 62   Ht 6\' 1"  (1.854 m)   Wt 214 lb (97.1 kg)   SpO2 98%   BMI 28.23 kg/m   Wt Readings from Last 3 Encounters:  02/08/23 215 lb (97.5 kg)  06/25/22 208 lb (94.3 kg)  04/13/22 204 lb 14.4 oz (92.9  kg)    General: Appears his stated age, overweight, in NAD. Skin: Warm, dry and intact. No rashes noted. HEENT: Head: normal shape and size; Eyes: sclera white, no icterus, conjunctiva pink, PERRLA and EOMs intact;  Neck:  Neck supple, trachea midline. No masses, lumps or thyromegaly present.  Cardiovascular: Normal rate and rhythm. S1,S2 noted.  No murmur, rubs or gallops noted. No JVD or BLE edema. No carotid bruits noted. Pulmonary/Chest: Normal effort and positive vesicular breath sounds. No respiratory distress. No wheezes, rales or ronchi noted.  Abdomen: Soft and nontender. Normal bowel sounds.  Musculoskeletal: Strength 5/5 BUE/BLE.  No difficulty with gait.  Neurological: Alert and oriented. Cranial nerves II-XII grossly intact. Coordination normal.  Psychiatric: Mood and affect normal. Behavior is normal. Judgment and thought content normal.     BMET    Component Value Date/Time   NA 140 02/08/2023 0946   NA 140 04/27/2018 0714   K 4.5 02/08/2023 0946   CL 103 02/08/2023 0946   CO2 22 02/08/2023 0946   GLUCOSE 177 (H) 02/08/2023 0946   BUN 21 02/08/2023 0946   BUN 13 04/27/2018 0714   CREATININE 1.07 02/08/2023 0946   CALCIUM 9.3 02/08/2023 0946   GFRNONAA 94 04/21/2021 0906   GFRAA 109 04/21/2021 0906    Lipid Panel     Component Value Date/Time   CHOL 257 (H) 02/08/2023 0946   TRIG 2,305 (H) 02/08/2023 0946   HDL 30 (L) 02/08/2023 0946   CHOLHDL 8.6 (H) 02/08/2023 0946   LDLCALC  02/08/2023 0946     Comment:     . LDL cholesterol not calculated. Triglyceride levels greater than 400 mg/dL invalidate calculated LDL results. . Reference range: <100 . Desirable range <100 mg/dL for primary prevention;   <70 mg/dL for patients with CHD or diabetic patients  with > or = 2 CHD risk factors. Marland Kitchen LDL-C is now calculated using the Martin-Hopkins  calculation, which is a validated novel method providing  better accuracy than the Friedewald equation in the   estimation of LDL-C.  Horald Pollen et al. Lenox Ahr. 6962;952(84): 2061-2068  (http://education.[facility].com/faq/FAQ164)     CBC    Component Value Date/Time   WBC 6.0 06/25/2022 0929   RBC 4.94 06/25/2022 0929   HGB 15.3 06/25/2022 0929   HGB 14.3 04/27/2018 0714   HCT 43.5 06/25/2022 0929   HCT 41.0 04/27/2018 0714   PLT 189 06/25/2022 0929   PLT 212 04/27/2018 0714   MCV 88.1 06/25/2022 0929   MCV 88 04/27/2018 0714   MCH 31.0 06/25/2022 0929   MCHC 35.2 06/25/2022 0929   RDW 12.5 06/25/2022 0929   RDW 13.2 04/27/2018 0714   LYMPHSABS 1,729 01/31/2019 1058   LYMPHSABS  1.6 04/27/2018 0714   EOSABS 221 01/31/2019 1058   EOSABS 0.3 04/27/2018 0714   BASOSABS 60 01/31/2019 1058   BASOSABS 0.0 04/27/2018 0714    Hgb A1C Lab Results  Component Value Date   HGBA1C 6.5 02/08/2023           Assessment & Plan:   Preventative health maintenance:  Flu shot declined Tetanus UTD Encouraged him to get his COVID-vaccine Discussed Shingrix vaccine, he will check coverage with his insurance company and schedule visit he would like to have this done Colon screening UTD Encouraged him to consume a balanced diet and exercise regimen Advised him to see an eye doctor and dentist annually We will check CBC, A1c, urine microalbumin, PSA and uric acid today  RTC in 6 months, follow-up chronic conditions Nicki Reaper, NP

## 2023-08-13 ENCOUNTER — Encounter: Payer: Self-pay | Admitting: Internal Medicine

## 2023-08-13 LAB — CBC
HCT: 43.5 % (ref 38.5–50.0)
Hemoglobin: 14.6 g/dL (ref 13.2–17.1)
MCH: 30.1 pg (ref 27.0–33.0)
MCHC: 33.6 g/dL (ref 32.0–36.0)
MCV: 89.7 fL (ref 80.0–100.0)
MPV: 12.5 fL (ref 7.5–12.5)
Platelets: 206 10*3/uL (ref 140–400)
RBC: 4.85 10*6/uL (ref 4.20–5.80)
RDW: 12.7 % (ref 11.0–15.0)
WBC: 7.3 10*3/uL (ref 3.8–10.8)

## 2023-08-13 LAB — PSA: PSA: 0.82 ng/mL (ref <=4.00)

## 2023-08-13 LAB — MICROALBUMIN / CREATININE URINE RATIO
Creatinine, Urine: 95 mg/dL (ref 20–320)
Microalb Creat Ratio: 3 mg/g{creat} (ref <30)
Microalb, Ur: 0.3 mg/dL

## 2023-08-13 LAB — URIC ACID: Uric Acid, Serum: 6.9 mg/dL (ref 4.0–8.0)

## 2023-08-13 NOTE — Assessment & Plan Note (Signed)
Encouraged diet and exercise for weight loss ?

## 2023-08-13 NOTE — Patient Instructions (Signed)
Health Maintenance, Male Adopting a healthy lifestyle and getting preventive care are important in promoting health and wellness. Ask your health care provider about: The right schedule for you to have regular tests and exams. Things you can do on your own to prevent diseases and keep yourself healthy. What should I know about diet, weight, and exercise? Eat a healthy diet  Eat a diet that includes plenty of vegetables, fruits, low-fat dairy products, and lean protein. Do not eat a lot of foods that are high in solid fats, added sugars, or sodium. Maintain a healthy weight Body mass index (BMI) is a measurement that can be used to identify possible weight problems. It estimates body fat based on height and weight. Your health care provider can help determine your BMI and help you achieve or maintain a healthy weight. Get regular exercise Get regular exercise. This is one of the most important things you can do for your health. Most adults should: Exercise for at least 150 minutes each week. The exercise should increase your heart rate and make you sweat (moderate-intensity exercise). Do strengthening exercises at least twice a week. This is in addition to the moderate-intensity exercise. Spend less time sitting. Even light physical activity can be beneficial. Watch cholesterol and blood lipids Have your blood tested for lipids and cholesterol at 60 years of age, then have this test every 5 years. You may need to have your cholesterol levels checked more often if: Your lipid or cholesterol levels are high. You are older than 60 years of age. You are at high risk for heart disease. What should I know about cancer screening? Many types of cancers can be detected early and may often be prevented. Depending on your health history and family history, you may need to have cancer screening at various ages. This may include screening for: Colorectal cancer. Prostate cancer. Skin cancer. Lung  cancer. What should I know about heart disease, diabetes, and high blood pressure? Blood pressure and heart disease High blood pressure causes heart disease and increases the risk of stroke. This is more likely to develop in people who have high blood pressure readings or are overweight. Talk with your health care provider about your target blood pressure readings. Have your blood pressure checked: Every 3-5 years if you are 18-39 years of age. Every year if you are 40 years old or older. If you are between the ages of 65 and 75 and are a current or former smoker, ask your health care provider if you should have a one-time screening for abdominal aortic aneurysm (AAA). Diabetes Have regular diabetes screenings. This checks your fasting blood sugar level. Have the screening done: Once every three years after age 45 if you are at a normal weight and have a low risk for diabetes. More often and at a younger age if you are overweight or have a high risk for diabetes. What should I know about preventing infection? Hepatitis B If you have a higher risk for hepatitis B, you should be screened for this virus. Talk with your health care provider to find out if you are at risk for hepatitis B infection. Hepatitis C Blood testing is recommended for: Everyone born from 1945 through 1965. Anyone with known risk factors for hepatitis C. Sexually transmitted infections (STIs) You should be screened each year for STIs, including gonorrhea and chlamydia, if: You are sexually active and are younger than 60 years of age. You are older than 60 years of age and your   health care provider tells you that you are at risk for this type of infection. Your sexual activity has changed since you were last screened, and you are at increased risk for chlamydia or gonorrhea. Ask your health care provider if you are at risk. Ask your health care provider about whether you are at high risk for HIV. Your health care provider  may recommend a prescription medicine to help prevent HIV infection. If you choose to take medicine to prevent HIV, you should first get tested for HIV. You should then be tested every 3 months for as long as you are taking the medicine. Follow these instructions at home: Alcohol use Do not drink alcohol if your health care provider tells you not to drink. If you drink alcohol: Limit how much you have to 0-2 drinks a day. Know how much alcohol is in your drink. In the [place] one drink equals one 12 oz bottle of beer (355 mL), one 5 oz glass of wine (148 mL), or one 1 oz glass of hard liquor (44 mL). Lifestyle Do not use any products that contain nicotine or tobacco. These products include cigarettes, chewing tobacco, and vaping devices, such as e-cigarettes. If you need help quitting, ask your health care provider. Do not use street drugs. Do not share needles. Ask your health care provider for help if you need support or information about quitting drugs. General instructions Schedule regular health, dental, and eye exams. Stay current with your vaccines. Tell your health care provider if: You often feel depressed. You have ever been abused or do not feel safe at home. Summary Adopting a healthy lifestyle and getting preventive care are important in promoting health and wellness. Follow your health care provider's instructions about healthy diet, exercising, and getting tested or screened for diseases. Follow your health care provider's instructions on monitoring your cholesterol and blood pressure. This information is not intended to replace advice given to you by your health care provider. Make sure you discuss any questions you have with your health care provider. Document Revised: 03/03/2021 Document Reviewed: 03/03/2021 Elsevier Patient Education  2024 [employer] Inc.  

## 2023-12-08 IMAGING — MR MR BRAIN/TEMPORAL BONE/IAC
12 of 13 series · 45 of 48 positions shown · IV contrast (multihance)
Comparison: None.

CLINICAL DATA: Dizziness/left-sided tinnitus.

EXAM:
MRI HEAD WITHOUT AND WITH CONTRAST
TECHNIQUE: Multiplanar, multiecho pulse sequences of the brain and surrounding
structures were obtained without and with intravenous contrast.
CONTRAST:  20mL MULTIHANCE GADOBENATE DIMEGLUMINE 529 MG/ML IV SOLN

[Series 5: T1 · sagittal · 4.0mm · 0.72mm/px · 2 of 30 slices shown (1 of 3)]
[im 1/30]
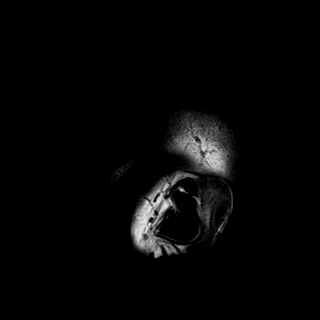
[im 30/30]
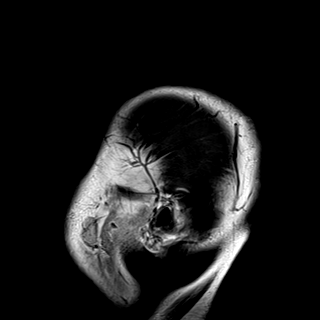

[Series 6: DWI · axial · 3.0mm · 0.94mm/px · z∈[-78,+83]mm · 12 of 183 slices shown]
[im 1/183]
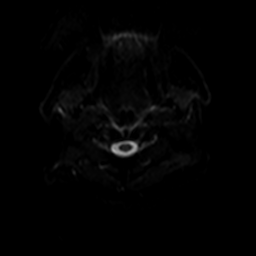
[im 17/183]
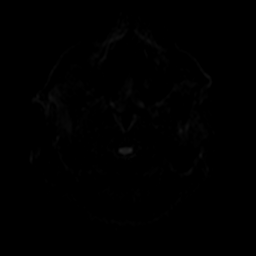
[im 34/183]
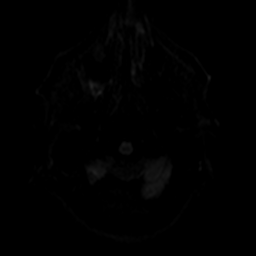
[im 50/183]
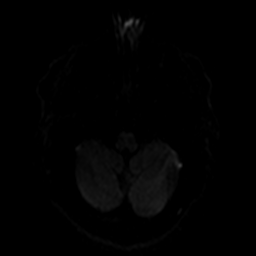
[im 67/183]
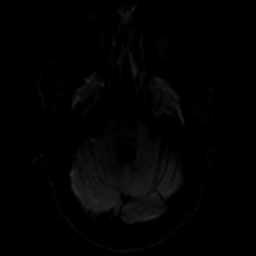
[im 83/183]
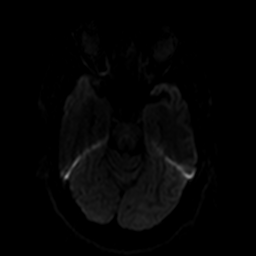
[im 100/183]
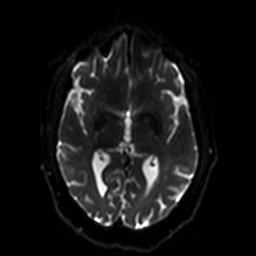
[im 116/183]
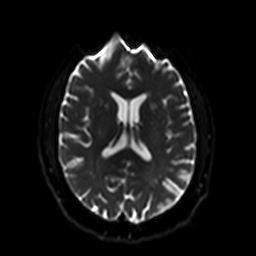
[im 133/183]
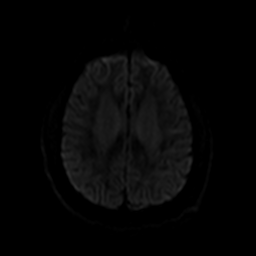
[im 149/183]
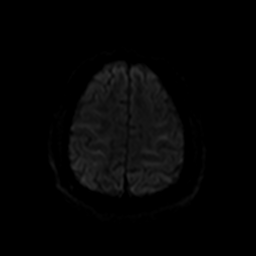
[im 166/183]
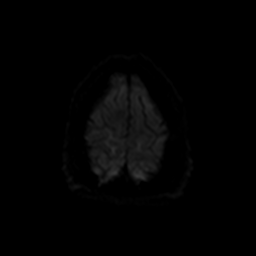
[im 183/183]
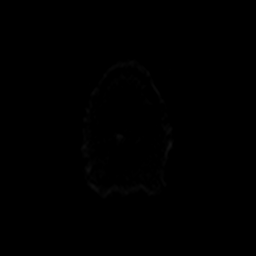

[Series 7: ax dwi_tracew · axial · 3.0mm · 0.94mm/px · z∈[-78,+83]mm · 6 of 92 slices shown]
[im 1/92]
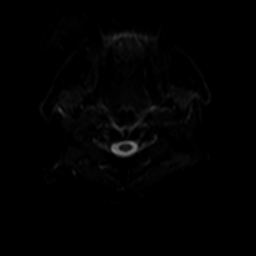
[im 19/92]
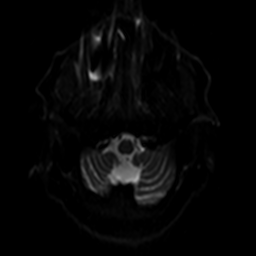
[im 37/92]
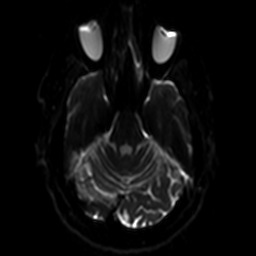
[im 55/92]
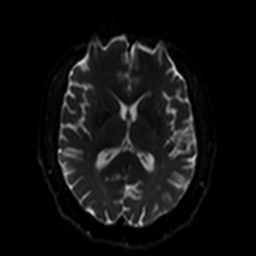
[im 73/92]
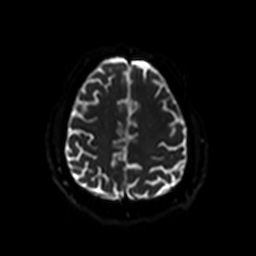
[im 92/92]
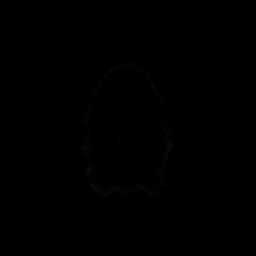

[Series 8: ax dwi_adc · axial · 3.0mm · 0.94mm/px · z∈[-78,+83]mm · 3 of 46 slices shown]
[im 1/46]
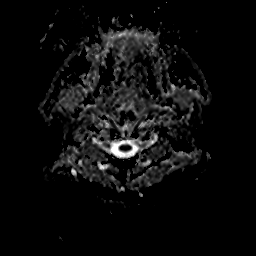
[im 23/46]
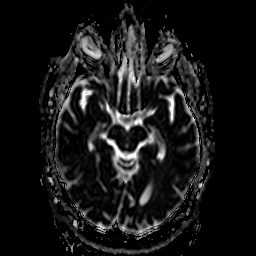
[im 46/46]
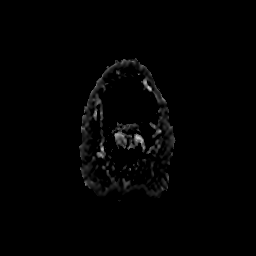

[Series 9: T2 · axial · 4.0mm · 0.36mm/px · z∈[-70,+90]mm · 2 of 32 slices shown]
[im 1/32]
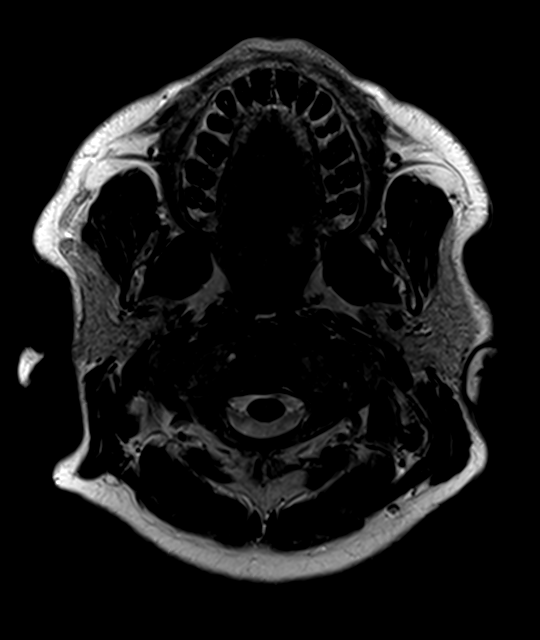
[im 32/32]
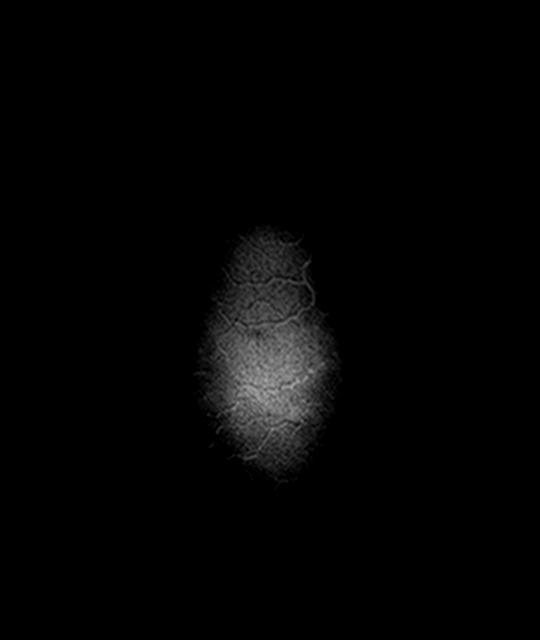

[Series 10: FLAIR · axial · 3.0mm · 0.72mm/px · z∈[-71,+90]mm · 2 of 28 slices shown]
[im 1/28]
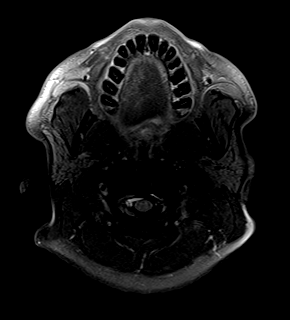
[im 28/28]
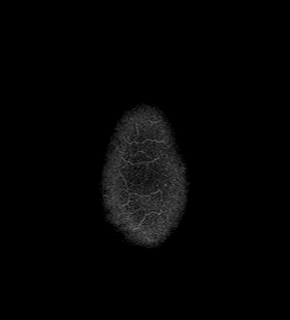

[Series 12: swi_images · axial · 3.0mm · 0.90mm/px · z∈[-66,+86]mm · 3 of 52 slices shown]
[im 1/52]
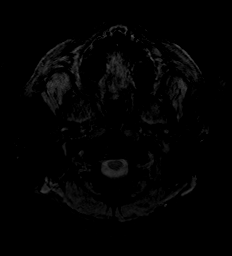
[im 26/52]
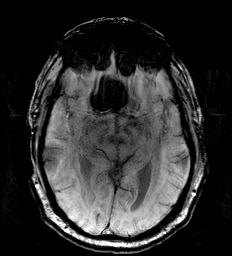
[im 52/52]
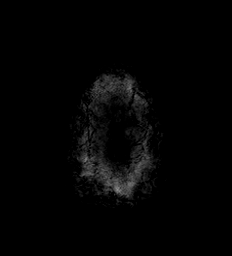

[Series 13: T1 · coronal · 3.0mm · 0.56mm/px · 1 of 13 slices shown (2 of 3)]
[im 1/13]
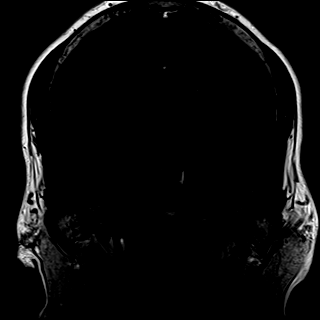

[Series 15: T1 · axial · 3.0mm · 0.50mm/px · 1 of 13 slices shown (3 of 3)]
[im 1/13]
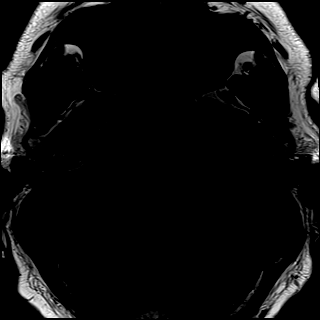

[Series 16: T1 post-contrast · coronal · 3.0mm · 0.56mm/px · 1 of 13 slices shown (1 of 3)]
[im 1/13]
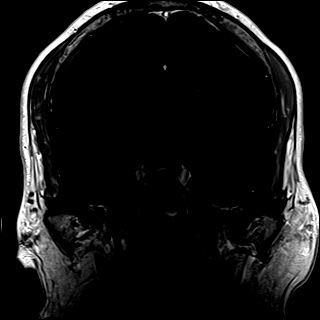

[Series 17: T1 post-contrast · axial · 3.0mm · 0.50mm/px · 1 of 13 slices shown (2 of 3)]
[im 1/13]
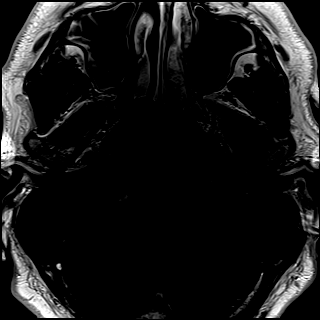

[Series 18: T1 post-contrast · axial · 1.0mm · 0.90mm/px · z∈[-69,+89]mm · 11 of 160 slices shown (3 of 3)]
[im 1/160]
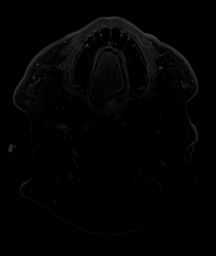
[im 16/160]
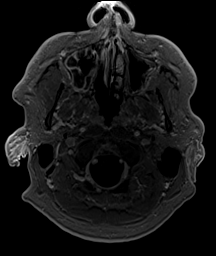
[im 32/160]
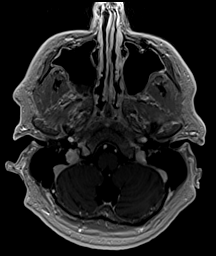
[im 48/160]
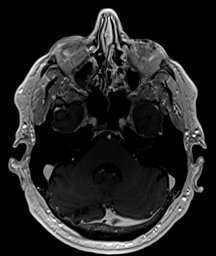
[im 64/160]
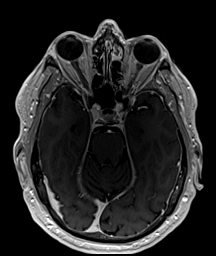
[im 80/160]
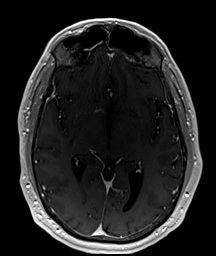
[im 96/160]
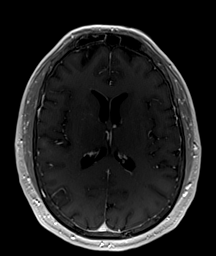
[im 112/160]
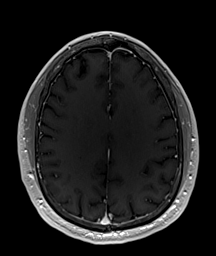
[im 128/160]
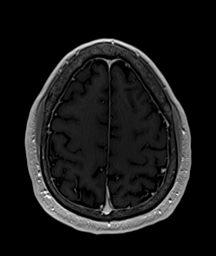
[im 144/160]
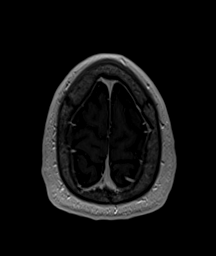
[im 160/160]
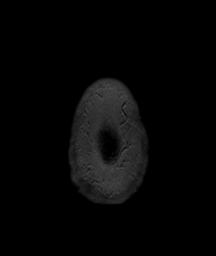

[45 of 48 positions shown; findings below may reference images not displayed]

FINDINGS: Brain: Mild generalized volume loss. Diffusion imaging does not show
any acute or subacute infarction. No focal abnormality affects the
brainstem or cerebellum. CP angle regions are normal. Seventh and
eighth nerve complexes are normal. No vestibular schwannoma or
enhancing neuritis. Inner ear structures appear normal. No fluid in
the middle ears or mastoids.

Cerebral hemispheres show scattered punctate foci of T2 and FLAIR
signal within the white matter consistent with an early
manifestation of small vessel change. No cortical or large vessel
territory insult. No mass lesion, hemorrhage, hydrocephalus or
extra-axial collection. No significant contrast enhancement.
Incidental and insignificant venous angioma at the right
frontoparietal junction without evidence of prior hemorrhage.

Vascular: Major vessels at the base of the brain show flow.

Skull and upper cervical spine: Negative

Sinuses/Orbits: Mucosal inflammatory changes of the paranasal
sinuses, most notably the maxillary sinuses. Orbits negative.

Other: None
IMPRESSION: No acute or reversible finding. No specific cause of the presenting
symptoms is identified. No vestibular schwannoma or other CP angle
region abnormal finding.

Mild generalized volume loss. Scattered punctate foci of T2 and
FLAIR signal within the cerebral hemispheric white matter, likely an
early manifestation of small vessel change.

Mucosal inflammatory changes of the paranasal sinuses, most notably
the maxillary sinuses.

## 2024-02-14 ENCOUNTER — Ambulatory Visit (INDEPENDENT_AMBULATORY_CARE_PROVIDER_SITE_OTHER): Payer: Self-pay | Admitting: Internal Medicine

## 2024-02-14 ENCOUNTER — Encounter: Payer: Self-pay | Admitting: Internal Medicine

## 2024-02-14 VITALS — BP 122/70 | Ht 73.0 in | Wt 212.4 lb

## 2024-02-14 DIAGNOSIS — I700 Atherosclerosis of aorta: Secondary | ICD-10-CM

## 2024-02-14 DIAGNOSIS — Z23 Encounter for immunization: Secondary | ICD-10-CM | POA: Diagnosis not present

## 2024-02-14 DIAGNOSIS — E1165 Type 2 diabetes mellitus with hyperglycemia: Secondary | ICD-10-CM

## 2024-02-14 DIAGNOSIS — E1169 Type 2 diabetes mellitus with other specified complication: Secondary | ICD-10-CM

## 2024-02-14 DIAGNOSIS — I2510 Atherosclerotic heart disease of native coronary artery without angina pectoris: Secondary | ICD-10-CM

## 2024-02-14 DIAGNOSIS — M1A0710 Idiopathic chronic gout, right ankle and foot, without tophus (tophi): Secondary | ICD-10-CM

## 2024-02-14 DIAGNOSIS — K219 Gastro-esophageal reflux disease without esophagitis: Secondary | ICD-10-CM

## 2024-02-14 DIAGNOSIS — Z6828 Body mass index (BMI) 28.0-28.9, adult: Secondary | ICD-10-CM

## 2024-02-14 DIAGNOSIS — E785 Hyperlipidemia, unspecified: Secondary | ICD-10-CM

## 2024-02-14 DIAGNOSIS — E663 Overweight: Secondary | ICD-10-CM

## 2024-02-14 NOTE — Assessment & Plan Note (Signed)
C-Met and lipid profile today Encouraged him to continue a low-fat diet We will revisit statin therapy based on labs He is allergic to aspirin 

## 2024-02-14 NOTE — Progress Notes (Signed)
 Subjective:    Patient ID: Eric Hogan, male    DOB: 07-19-1961, 63 y.o.   MRN: [id number]  HPI  Patient presents to clinic today for 44-month follow-up of chronic conditions.  PMR: He denies pain at this time. He is not currently taking any medications for this.  He follows with rheumatology.  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  He is not currently taking any medications for this.  There is no upper GI on file.  HLD with CAD, aortic atherosclerosis: His last LDL was 87, triglycerides 308, 05/2023.  He is not taking any cholesterol lowering medication at this time. He is allergic aspirin.  He tries to consume low-fat diet.  DM 2: His last A1c was 6.8%, 07/2023.  He is not taking any oral diabetic medication at this time but he has been on glipizide  in the past. He checks his sugars intermittently. His last eye exam was within the last year, [facility].  He does not check his feet routinely.  Flu 05/2022.  Pneumovax never.  COVID never.  Gout: Mainly in his right foot. He does not take any medication for this. He follows with rheumatology.  Review of Systems     Past Medical History:  Diagnosis Date   Gout    Hiatal hernia    Polyp of colon     Current Outpatient Medications  Medication Sig Dispense Refill   Calcium  Polycarbophil (FIBER-CAPS PO) Take by mouth.     Omega-3 1000 MG CAPS Take by mouth.     Potassium Citrate 15 MEQ (1620 MG) TBCR Take by mouth.     TART CHERRY PO Take by mouth. Takes gummies     No current facility-administered medications for this visit.    Allergies  Allergen Reactions   Aspirin Hives and Swelling   Evolocumab Hives   Ibuprofen Swelling   Statins Palpitations    Family History  Problem Relation Age of Onset   Hyperlipidemia Mother    Diabetes Father    Heart disease Father     Social History   Socioeconomic History   Marital status: Divorced    Spouse name: Not on file   Number of children: Not on file   Years of education: Not  on file   Highest education level: Not on file  Occupational History   Not on file  Tobacco Use   Smoking status: Former    Current packs/day: 0.00    Types: Cigarettes    Quit date: 02/20/2018    Years since quitting: 5.9   Smokeless tobacco: Never   Tobacco comments:    Short term a few times over many years. Did smoke for a time during pandemic  Vaping Use   Vaping status: Never Used  Substance and Sexual Activity   Alcohol use: Yes    Alcohol/week: 12.0 standard drinks of alcohol    Types: 12 Standard drinks or equivalent per week   Drug use: No   Sexual activity: Not on file  Other Topics Concern   Not on file  Social History Narrative   Not on file   Social Drivers of Health   Financial Resource Strain: Not on file  Food Insecurity: Not on file  Transportation Needs: Not on file  Physical Activity: Not on file  Stress: Not on file  Social Connections: Not on file  Intimate Partner Violence: Not on file     Constitutional: Denies fever, malaise, fatigue, headache or abrupt weight changes.  HEENT: Pt reports  ear clicking. Denies eye pain, eye redness, ear pain, ringing in the ears, wax buildup, runny nose, nasal congestion, bloody nose, or sore throat. Respiratory: Denies difficulty breathing, shortness of breath, cough or sputum production.   Cardiovascular: Denies chest pain, chest tightness, palpitations or swelling in the hands or feet.  Gastrointestinal: Patient reports intermittent reflux.  Denies abdominal pain, bloating, constipation, diarrhea or blood in the stool.  GU: Denies urgency, frequency, pain with urination, burning sensation, blood in urine, odor or discharge. Musculoskeletal: Denies decrease in range of motion, difficulty with gait, muscle pain or joint pain and swelling.  Skin: Denies redness, rashes, lesions or ulcercations.  Neurological: Denies dizziness, difficulty with memory, difficulty with speech or problems with balance and coordination.   Psych: Denies anxiety, depression, SI/HI.  No other specific complaints in a complete review of systems (except as listed in HPI above).  Objective:   Physical Exam  BP 122/70 (BP Location: Left Arm, Patient Position: Sitting, Cuff Size: Normal)   Ht 6\' 1"  (1.854 m)   Wt 212 lb 6.4 oz (96.3 kg)   BMI 28.02 kg/m    Wt Readings from Last 3 Encounters:  08/12/23 214 lb (97.1 kg)  02/08/23 215 lb (97.5 kg)  06/25/22 208 lb (94.3 kg)    General: Appears his stated age, overweight, in NAD. Skin: Warm, dry and intact. No ulcerations noted. HEENT: Head: normal shape and size; Eyes: sclera white, no icterus, conjunctiva pink, PERRLA and EOMs intact;  Cardiovascular: Normal rate and rhythm. S1,S2 noted.  No murmur, rubs or gallops noted. No JVD or BLE edema. No carotid bruits noted. Pulmonary/Chest: Normal effort and positive vesicular breath sounds. No respiratory distress. No wheezes, rales or ronchi noted.  Abdomen: Normal bowel sounds.  Musculoskeletal: No signs of joint swelling. No difficulty with gait.  Neurological: Alert and oriented.  Coordination normal.      BMET    Component Value Date/Time   NA 140 02/08/2023 0946   NA 140 04/27/2018 0714   K 4.5 02/08/2023 0946   CL 103 02/08/2023 0946   CO2 22 02/08/2023 0946   GLUCOSE 177 (H) 02/08/2023 0946   BUN 21 02/08/2023 0946   BUN 13 04/27/2018 0714   CREATININE 1.07 02/08/2023 0946   CALCIUM  9.3 02/08/2023 0946   GFRNONAA 94 04/21/2021 0906   GFRAA 109 04/21/2021 0906    Lipid Panel     Component Value Date/Time   CHOL 257 (H) 02/08/2023 0946   TRIG 2,305 (H) 02/08/2023 0946   HDL 30 (L) 02/08/2023 0946   CHOLHDL 8.6 (H) 02/08/2023 0946   LDLCALC  02/08/2023 0946     Comment:     . LDL cholesterol not calculated. Triglyceride levels greater than 400 mg/dL invalidate calculated LDL results. . Reference range: <100 . Desirable range <100 mg/dL for primary prevention;   <70 mg/dL for patients with CHD or  diabetic patients  with > or = 2 CHD risk factors. Aaron Aas LDL-C is now calculated using the Martin-Hopkins  calculation, which is a validated novel method providing  better accuracy than the Friedewald equation in the  estimation of LDL-C.  Melinda Sprawls et al. Erroll Heard. 8295;621(30): 2061-2068  (http://education.[facility].com/faq/FAQ164)     CBC    Component Value Date/Time   WBC 7.3 08/12/2023 1526   RBC 4.85 08/12/2023 1526   HGB 14.6 08/12/2023 1526   HGB 14.3 04/27/2018 0714   HCT 43.5 08/12/2023 1526   HCT 41.0 04/27/2018 0714   PLT 206 08/12/2023 1526  PLT 212 04/27/2018 0714   MCV 89.7 08/12/2023 1526   MCV 88 04/27/2018 0714   MCH 30.1 08/12/2023 1526   MCHC 33.6 08/12/2023 1526   RDW 12.7 08/12/2023 1526   RDW 13.2 04/27/2018 0714   LYMPHSABS 1,729 01/31/2019 1058   LYMPHSABS 1.6 04/27/2018 0714   EOSABS 221 01/31/2019 1058   EOSABS 0.3 04/27/2018 0714   BASOSABS 60 01/31/2019 1058   BASOSABS 0.0 04/27/2018 0714    Hgb A1C Lab Results  Component Value Date   HGBA1C 6.8 (A) 08/12/2023          Assessment & Plan:     RTC in 6 months for your annual exam Helayne Lo, NP

## 2024-02-14 NOTE — Patient Instructions (Signed)

## 2024-02-14 NOTE — Assessment & Plan Note (Signed)
 Follows with rheumatology.

## 2024-02-14 NOTE — Assessment & Plan Note (Signed)
 A1C today Urine microalbumin has been checked within the last year He is not currently taking any medications for this Encourage low-carb diet and exercise for weight loss Encouraged him to tell his eye doctor that he has diabetes so that they can do a diabetic eye exam Encouraged routine foot exam Encouraged him to get a flu shot in the fall Prevnar 20 today He declines COVID-vaccine

## 2024-02-14 NOTE — Assessment & Plan Note (Signed)
 Encouraged diet and exercise for weight loss ?

## 2024-02-14 NOTE — Assessment & Plan Note (Signed)
Try to avoid food or drink that causes reflux Encourage weight loss as this can help reduce reflux symptoms

## 2024-02-15 ENCOUNTER — Encounter: Payer: Self-pay | Admitting: Internal Medicine

## 2024-02-15 LAB — HEMOGLOBIN A1C
Hgb A1c MFr Bld: 8 % — ABNORMAL HIGH (ref <5.7)
Mean Plasma Glucose: 183 mg/dL
eAG (mmol/L): 10.1 mmol/L

## 2024-02-15 LAB — COMPREHENSIVE METABOLIC PANEL WITH GFR
AG Ratio: 2 (calc) (ref 1.0–2.5)
ALT: 27 U/L (ref 9–46)
AST: 20 U/L (ref 10–35)
Albumin: 4.4 g/dL (ref 3.6–5.1)
Alkaline phosphatase (APISO): 87 U/L (ref 35–144)
BUN: 16 mg/dL (ref 7–25)
CO2: 26 mmol/L (ref 20–32)
Calcium: 9.1 mg/dL (ref 8.6–10.3)
Chloride: 103 mmol/L (ref 98–110)
Creat: 1.06 mg/dL (ref 0.70–1.35)
Globulin: 2.2 g/dL (ref 1.9–3.7)
Glucose, Bld: 203 mg/dL — ABNORMAL HIGH (ref 65–139)
Potassium: 4.1 mmol/L (ref 3.5–5.3)
Sodium: 139 mmol/L (ref 135–146)
Total Bilirubin: 0.4 mg/dL (ref 0.2–1.2)
Total Protein: 6.6 g/dL (ref 6.1–8.1)
eGFR: 80 mL/min/{1.73_m2} (ref >=60)

## 2024-02-15 LAB — CBC
HCT: 40.4 % (ref 38.5–50.0)
Hemoglobin: 13.7 g/dL (ref 13.2–17.1)
MCH: 30.5 pg (ref 27.0–33.0)
MCHC: 33.9 g/dL (ref 32.0–36.0)
MCV: 90 fL (ref 80.0–100.0)
MPV: 12.1 fL (ref 7.5–12.5)
Platelets: 180 10*3/uL (ref 140–400)
RBC: 4.49 10*6/uL (ref 4.20–5.80)
RDW: 12.6 % (ref 11.0–15.0)
WBC: 6.7 10*3/uL (ref 3.8–10.8)

## 2024-02-15 LAB — LIPID PANEL
Cholesterol: 198 mg/dL (ref <200)
HDL: 40 mg/dL (ref >=40)
Non-HDL Cholesterol (Calc): 158 mg/dL — ABNORMAL HIGH (ref <130)
Total CHOL/HDL Ratio: 5 (calc) — ABNORMAL HIGH (ref <5.0)
Triglycerides: 642 mg/dL — ABNORMAL HIGH (ref <150)

## 2024-03-21 ENCOUNTER — Telehealth: Payer: Self-pay

## 2024-03-21 NOTE — Transitions of Care (Post Inpatient/ED Visit) (Signed)
   03/21/2024  Name: Eric Hogan MRN: [id number] DOB: 10-06-1961  Today's TOC FU Call Status: Today's TOC FU Call Status:: Successful TOC FU Call Completed TOC FU Call Complete Date: 03/21/24 Patient's Name and Date of Birth confirmed.  Transition Care Management Follow-up Telephone Call Date of Discharge: 03/19/24 Discharge Facility: Other [facility]) Name of Other (Non[facility]) Discharge Facility: [facility] Type of Discharge: Inpatient Admission Primary Inpatient Discharge Diagnosis:: Calculus of kidney How have you been since you were released from the hospital?: Better Any questions or concerns?: No  Items Reviewed: Did you receive and understand the discharge instructions provided?: Yes Medications obtained,verified, and reconciled?: Yes (Medications Reviewed) Any new allergies since your discharge?: No Dietary orders reviewed?: NA Do you have support at home?: Yes  Medications Reviewed Today: Medications Reviewed Today     Reviewed by Chauncy Coral, CMA (Certified Medical Assistant) on 03/21/24 at 1125  Med List Status: <None>   Medication Order Taking? Sig Documenting Provider Last Dose Status Informant  Calcium  Polycarbophil (FIBER-CAPS PO) [id number] No Take by mouth. [provider] Taking Active   Omega-3 1000 MG CAPS [id number] No Take by mouth. [provider] Taking Active   Potassium Citrate 15 MEQ (1620 MG) TBCR [id number] No Take by mouth. [provider] Taking Active   TART CHERRY PO [id number] No Take by mouth. Takes gummies [provider] Taking Active Self            Home Care and Equipment/Supplies: Were Home Health Services Ordered?: No Any new equipment or medical supplies ordered?: No  Functional Questionnaire: Do you need assistance with bathing/showering or dressing?: No Do you need assistance with meal preparation?: No Do you need assistance with eating?: No Do you have  difficulty maintaining continence: No Do you need assistance with getting out of bed/getting out of a chair/moving?: No Do you have difficulty managing or taking your medications?: No  Follow up appointments reviewed: PCP Follow-up appointment confirmed?: NA (pt declined, following up with Urology) Specialist Hospital Follow-up appointment confirmed?: Yes Follow-Up Specialty Provider:: [facility] Do you need transportation to your follow-up appointment?: No Do you understand care options if your condition(s) worsen?: Yes-patient verbalized understanding    SIGNATURE Germain Kohler, CMA (AAMA)  [facility] [phone]

## 2024-08-17 ENCOUNTER — Ambulatory Visit: Admitting: Internal Medicine

## 2024-08-17 ENCOUNTER — Encounter: Payer: Self-pay | Admitting: Internal Medicine

## 2024-08-17 VITALS — BP 128/80 | Ht 73.0 in | Wt 211.2 lb

## 2024-08-17 DIAGNOSIS — Z23 Encounter for immunization: Secondary | ICD-10-CM | POA: Diagnosis not present

## 2024-08-17 DIAGNOSIS — E663 Overweight: Secondary | ICD-10-CM

## 2024-08-17 DIAGNOSIS — Z6827 Body mass index (BMI) 27.0-27.9, adult: Secondary | ICD-10-CM

## 2024-08-17 DIAGNOSIS — Z0001 Encounter for general adult medical examination with abnormal findings: Secondary | ICD-10-CM | POA: Diagnosis not present

## 2024-08-17 DIAGNOSIS — E1165 Type 2 diabetes mellitus with hyperglycemia: Secondary | ICD-10-CM | POA: Diagnosis not present

## 2024-08-17 DIAGNOSIS — Z125 Encounter for screening for malignant neoplasm of prostate: Secondary | ICD-10-CM

## 2024-08-17 DIAGNOSIS — M1A0710 Idiopathic chronic gout, right ankle and foot, without tophus (tophi): Secondary | ICD-10-CM

## 2024-08-17 NOTE — Assessment & Plan Note (Signed)
 Encouraged diet and exercise for weight loss ?

## 2024-08-17 NOTE — Patient Instructions (Signed)
 Health Maintenance, Male  Adopting a healthy lifestyle and getting preventive care are important in promoting health and wellness. Ask your health care provider about:  The right schedule for you to have regular tests and exams.  Things you can do on your own to prevent diseases and keep yourself healthy.  What should I know about diet, weight, and exercise?  Eat a healthy diet    Eat a diet that includes plenty of vegetables, fruits, low-fat dairy products, and lean protein.  Do not eat a lot of foods that are high in solid fats, added sugars, or sodium.  Maintain a healthy weight  Body mass index (BMI) is a measurement that can be used to identify possible weight problems. It estimates body fat based on height and weight. Your health care provider can help determine your BMI and help you achieve or maintain a healthy weight.  Get regular exercise  Get regular exercise. This is one of the most important things you can do for your health. Most adults should:  Exercise for at least 150 minutes each week. The exercise should increase your heart rate and make you sweat (moderate-intensity exercise).  Do strengthening exercises at least twice a week. This is in addition to the moderate-intensity exercise.  Spend less time sitting. Even light physical activity can be beneficial.  Watch cholesterol and blood lipids  Have your blood tested for lipids and cholesterol at 61 years of age, then have this test every 5 years.  You may need to have your cholesterol levels checked more often if:  Your lipid or cholesterol levels are high.  You are older than 61 years of age.  You are at high risk for heart disease.  What should I know about cancer screening?  Many types of cancers can be detected early and may often be prevented. Depending on your health history and family history, you may need to have cancer screening at various ages. This may include screening for:  Colorectal cancer.  Prostate cancer.  Skin cancer.  Lung  cancer.  What should I know about heart disease, diabetes, and high blood pressure?  Blood pressure and heart disease  High blood pressure causes heart disease and increases the risk of stroke. This is more likely to develop in people who have high blood pressure readings or are overweight.  Talk with your health care provider about your target blood pressure readings.  Have your blood pressure checked:  Every 3-5 years if you are 61-61 years of age.  Every year if you are 3 years old or older.  If you are between the ages of 60 and 72 and are a current or former smoker, ask your health care provider if you should have a one-time screening for abdominal aortic aneurysm (AAA).  Diabetes  Have regular diabetes screenings. This checks your fasting blood sugar level. Have the screening done:  Once every three years after age 61 if you are at a normal weight and have a low risk for diabetes.  More often and at a younger age if you are overweight or have a high risk for diabetes.  What should I know about preventing infection?  Hepatitis B  If you have a higher risk for hepatitis B, you should be screened for this virus. Talk with your health care provider to find out if you are at risk for hepatitis B infection.  Hepatitis C  Blood testing is recommended for:  Everyone born from [birth year] through 1963.  Anyone  with known risk factors for hepatitis C.  Sexually transmitted infections (STIs)  You should be screened each year for STIs, including gonorrhea and chlamydia, if:  You are sexually active and are younger than 61 years of age.  You are older than 61 years of age and your health care provider tells you that you are at risk for this type of infection.  Your sexual activity has changed since you were last screened, and you are at increased risk for chlamydia or gonorrhea. Ask your health care provider if you are at risk.  Ask your health care provider about whether you are at high risk for HIV. Your health care provider  may recommend a prescription medicine to help prevent HIV infection. If you choose to take medicine to prevent HIV, you should first get tested for HIV. You should then be tested every 3 months for as long as you are taking the medicine.  Follow these instructions at home:  Alcohol use  Do not drink alcohol if your health care provider tells you not to drink.  If you drink alcohol:  Limit how much you have to 0-2 drinks a day.  Know how much alcohol is in your drink. In the [place] one drink equals one 12 oz bottle of beer (355 mL), one 5 oz glass of wine (148 mL), or one 1 oz glass of hard liquor (44 mL).  Lifestyle  Do not use any products that contain nicotine or tobacco. These products include cigarettes, chewing tobacco, and vaping devices, such as e-cigarettes. If you need help quitting, ask your health care provider.  Do not use street drugs.  Do not share needles.  Ask your health care provider for help if you need support or information about quitting drugs.  General instructions  Schedule regular health, dental, and eye exams.  Stay current with your vaccines.  Tell your health care provider if:  You often feel depressed.  You have ever been abused or do not feel safe at home.  Summary  Adopting a healthy lifestyle and getting preventive care are important in promoting health and wellness.  Follow your health care provider's instructions about healthy diet, exercising, and getting tested or screened for diseases.  Follow your health care provider's instructions on monitoring your cholesterol and blood pressure.  This information is not intended to replace advice given to you by your health care provider. Make sure you discuss any questions you have with your health care provider.  Document Revised: 03/03/2021 Document Reviewed: 03/03/2021  Elsevier Patient Education  2024 [employer].

## 2024-08-17 NOTE — Progress Notes (Signed)
 Subjective:    Patient ID: Eric Hogan, male    DOB: [birth year], 63 y.o.   MRN: [id number]  HPI  Patient presents to clinic today for his annual exam.  Flu: 05/2022 Tetanus: 07/2015 COVID: x 2 Prevnar: 01/2024 Shingrix: Never PSA screening: 07/2023 Colon screening: 08/2022 Vision screening: as needed Dentist: biannually  Diet: He does eat meat. He consumes more veggies than fruit. He tries to avoid fried foods. He drinks mostly water, club soda. Exercise: None  Review of Systems     Past Medical History:  Diagnosis Date   Gout    Hiatal hernia    Polyp of colon     Current Outpatient Medications  Medication Sig Dispense Refill   Calcium  Polycarbophil (FIBER-CAPS PO) Take by mouth.     Omega-3 1000 MG CAPS Take by mouth.     Potassium Citrate 15 MEQ (1620 MG) TBCR Take by mouth.     TART CHERRY PO Take by mouth. Takes gummies     No current facility-administered medications for this visit.    Allergies  Allergen Reactions   Aspirin Hives and Swelling   Evolocumab Hives   Ibuprofen Swelling   Statins Palpitations    Family History  Problem Relation Age of Onset   Hyperlipidemia Mother    Diabetes Father    Heart disease Father     Social History   Socioeconomic History   Marital status: Divorced    Spouse name: Not on file   Number of children: Not on file   Years of education: Not on file   Highest education level: Not on file  Occupational History   Not on file  Tobacco Use   Smoking status: Former    Current packs/day: 0.00    Types: Cigarettes    Quit date: 02/20/2018    Years since quitting: 6.4   Smokeless tobacco: Never   Tobacco comments:    Short term a few times over many years. Did smoke for a time during pandemic  Vaping Use   Vaping status: Never Used  Substance and Sexual Activity   Alcohol use: Yes    Alcohol/week: 12.0 standard drinks of alcohol    Types: 12 Standard drinks or equivalent per week   Drug use: No   Sexual  activity: Not on file  Other Topics Concern   Not on file  Social History Narrative   Not on file   Social Drivers of Health   Financial Resource Strain: Not on file  Food Insecurity: No Food Insecurity (10/26/2023)   Received from [facility]   Hunger Vital Sign    Within the past 12 months, you worried that your food would run out before you got the money to buy more.: Never true    Within the past 12 months, the food you bought just didn't last and you didn't have money to get more.: Never true  Transportation Needs: No Transportation Needs (10/26/2023)   Received from [facility]   PRAPARE - Transportation    Lack of Transportation (Medical): No    Lack of Transportation (Non-Medical): No  Physical Activity: Not on file  Stress: Not on file  Social Connections: Not on file  Intimate Partner Violence: Not At Risk (10/26/2023)   Received from [facility]   Humiliation, Afraid, Rape, and Kick questionnaire    Within the last year, have you been afraid of your partner or ex-partner?: No    Within the  last year, have you been humiliated or emotionally abused in other ways by your partner or ex-partner?: No    Within the last year, have you been kicked, hit, slapped, or otherwise physically hurt by your partner or ex-partner?: No    Within the last year, have you been raped or forced to have any kind of sexual activity by your partner or ex-partner?: No     Constitutional: Denies fever, malaise, fatigue, headache or abrupt weight changes.  HEENT: Denies eye pain, eye redness, ringing in the ears, wax buildup, runny nose, nasal congestion, bloody nose, or sore throat. Respiratory: Denies difficulty breathing, shortness of breath, cough or sputum production.   Cardiovascular: Denies chest pain, chest tightness, palpitations or swelling in the hands or feet.  Gastrointestinal: Denies abdominal pain, bloating, constipation, diarrhea or blood in the  stool.  GU: Denies urgency, frequency, pain with urination, burning sensation, blood in urine, odor or discharge. Musculoskeletal: Patient reports joint pain.  Denies decrease in range of motion, difficulty with gait, muscle pain or joint swelling.  Skin: Pt reports itching. Denies redness, rashes, lesions or ulcercations.  Neurological: Pt reports intermittent dizziness. Denies difficulty with memory, difficulty with speech or problems with balance and coordination.  Psych: Denies anxiety, depression, SI/HI.  No other specific complaints in a complete review of systems (except as listed in HPI above).  Objective:   Physical Exam  BP 128/80 (BP Location: Left Arm, Patient Position: Sitting, Cuff Size: Normal)   Ht 6' 1 (1.854 m)   Wt 211 lb 3.2 oz (95.8 kg)   BMI 27.86 kg/m    Wt Readings from Last 3 Encounters:  02/14/24 212 lb 6.4 oz (96.3 kg)  08/12/23 214 lb (97.1 kg)  02/08/23 215 lb (97.5 kg)    General: Appears his stated age, overweight, in NAD. Skin: Warm, dry and intact. No rashes noted. HEENT: Head: normal shape and size; Eyes: sclera white, no icterus, conjunctiva pink, PERRLA and EOMs intact;  Neck:  Neck supple, trachea midline. No masses, lumps or thyromegaly present.  Cardiovascular: Normal rate and rhythm. S1,S2 noted.  No murmur, rubs or gallops noted. No JVD or BLE edema. No carotid bruits noted. Pulmonary/Chest: Normal effort and positive vesicular breath sounds. No respiratory distress. No wheezes, rales or ronchi noted.  Abdomen: Soft and nontender. Normal bowel sounds.  Musculoskeletal: Strength 5/5 BUE/BLE.  No difficulty with gait.  Neurological: Alert and oriented. Cranial nerves II-XII grossly intact. Coordination normal.  Psychiatric: Mood and affect normal. Behavior is normal. Judgment and thought content normal.     BMET    Component Value Date/Time   NA 139 02/14/2024 1522   NA 140 04/27/2018 0714   K 4.1 02/14/2024 1522   CL 103 02/14/2024  1522   CO2 26 02/14/2024 1522   GLUCOSE 203 (H) 02/14/2024 1522   BUN 16 02/14/2024 1522   BUN 13 04/27/2018 0714   CREATININE 1.06 02/14/2024 1522   CALCIUM  9.1 02/14/2024 1522   GFRNONAA 94 04/21/2021 0906   GFRAA 109 04/21/2021 0906    Lipid Panel     Component Value Date/Time   CHOL 198 02/14/2024 1522   TRIG 642 (H) 02/14/2024 1522   HDL 40 02/14/2024 1522   CHOLHDL 5.0 (H) 02/14/2024 1522   LDLCALC  02/14/2024 1522     Comment:     . LDL cholesterol not calculated. Triglyceride levels greater than 400 mg/dL invalidate calculated LDL results. . Reference range: <100 . Desirable range <100 mg/dL for primary  prevention;   <70 mg/dL for patients with CHD or diabetic patients  with > or = 2 CHD risk factors. SABRA LDL-C is now calculated using the Martin-Hopkins  calculation, which is a validated novel method providing  better accuracy than the Friedewald equation in the  estimation of LDL-C.  Gladis APPLETHWAITE et al. SANDREA. 7986;689(80): 2061-2068  (http://education.[employer].com/faq/FAQ164)     CBC    Component Value Date/Time   WBC 6.7 02/14/2024 1522   RBC 4.49 02/14/2024 1522   HGB 13.7 02/14/2024 1522   HGB 14.3 04/27/2018 0714   HCT 40.4 02/14/2024 1522   HCT 41.0 04/27/2018 0714   PLT 180 02/14/2024 1522   PLT 212 04/27/2018 0714   MCV 90.0 02/14/2024 1522   MCV 88 04/27/2018 0714   MCH 30.5 02/14/2024 1522   MCHC 33.9 02/14/2024 1522   RDW 12.6 02/14/2024 1522   RDW 13.2 04/27/2018 0714   LYMPHSABS 1,729 01/31/2019 1058   LYMPHSABS 1.6 04/27/2018 0714   EOSABS 221 01/31/2019 1058   EOSABS 0.3 04/27/2018 0714   BASOSABS 60 01/31/2019 1058   BASOSABS 0.0 04/27/2018 0714    Hgb A1C Lab Results  Component Value Date   HGBA1C 8.0 (H) 02/14/2024           Assessment & Plan:   Preventative health maintenance:  Flu shot today Tetanus UTD Encouraged him to get his COVID-vaccine Prevnar UTD Discussed Shingrix vaccine, he will check coverage  with his insurance company and schedule visit he would like to have this done Colon screening UTD Encouraged him to consume a balanced diet and exercise regimen Advised him to see an eye doctor and dentist annually We will check CBC, c-Met, lipid, A1C, urine microalbumin, uric acid and PSA today  RTC in 6 months, follow-up chronic conditions Angeline Laura, NP

## 2024-08-18 ENCOUNTER — Ambulatory Visit: Payer: Self-pay | Admitting: Internal Medicine

## 2024-08-18 LAB — CBC
HCT: 41.6 % (ref 38.5–50.0)
Hemoglobin: 14.2 g/dL (ref 13.2–17.1)
MCH: 30.9 pg (ref 27.0–33.0)
MCHC: 34.1 g/dL (ref 32.0–36.0)
MCV: 90.4 fL (ref 80.0–100.0)
MPV: 11.9 fL (ref 7.5–12.5)
Platelets: 186 Thousand/uL (ref 140–400)
RBC: 4.6 Million/uL (ref 4.20–5.80)
RDW: 12.6 % (ref 11.0–15.0)
WBC: 6.2 Thousand/uL (ref 3.8–10.8)

## 2024-08-18 LAB — HEMOGLOBIN A1C
Hgb A1c MFr Bld: 6.6 % — ABNORMAL HIGH (ref <5.7)
Mean Plasma Glucose: 143 mg/dL
eAG (mmol/L): 7.9 mmol/L

## 2024-08-18 LAB — MICROALBUMIN / CREATININE URINE RATIO
Creatinine, Urine: 100 mg/dL (ref 20–320)
Microalb Creat Ratio: 5 mg/g{creat} (ref <30)
Microalb, Ur: 0.5 mg/dL

## 2024-08-18 LAB — LIPID PANEL
Cholesterol: 213 mg/dL — ABNORMAL HIGH (ref <200)
HDL: 41 mg/dL (ref >=40)
Non-HDL Cholesterol (Calc): 172 mg/dL — ABNORMAL HIGH (ref <130)
Total CHOL/HDL Ratio: 5.2 (calc) — ABNORMAL HIGH (ref <5.0)
Triglycerides: 427 mg/dL — ABNORMAL HIGH (ref <150)

## 2024-08-18 LAB — PSA: PSA: 1.32 ng/mL (ref <=4.00)

## 2024-08-18 LAB — URIC ACID: Uric Acid, Serum: 9 mg/dL — ABNORMAL HIGH (ref 4.0–8.0)

## 2024-11-08 ENCOUNTER — Telehealth: Payer: Self-pay | Admitting: Internal Medicine

## 2024-11-08 MED ORDER — GLIPIZIDE 5 MG PO TABS: 5.0000 mg | ORAL_TABLET | Freq: Every day | ORAL | 0 refills | Status: AC

## 2024-11-08 NOTE — Telephone Encounter (Signed)
 Patient advised.

## 2024-11-08 NOTE — Telephone Encounter (Signed)
 Copied from CRM #[id number]. Topic: Clinical - Medication Refill >> Nov 08, 2024  9:41 AM Deleta RAMAN wrote: Medication: glipiZIDE  (GLUCOTROL ) 10 MG tablet   Has the patient contacted their pharmacy? No (Agent: If no, request that the patient contact the pharmacy for the refill. If patient does not wish to contact the pharmacy document the reason why and proceed with request.) (Agent: If yes, when and what did the pharmacy advise?)  This is the patient's preferred pharmacy:  [id number] - [place], [place] - [address] [place] [address] Phone: [phone] Fax: [phone]  Is this the correct pharmacy for this prescription? Yes If no, delete pharmacy and type the correct one.   Has the prescription been filled recently? No  Is the patient out of the medication? No  Has the patient been seen for an appointment in the last year OR does the patient have an upcoming appointment? Yes  Can we respond through MyChart? No  Agent: Please be advised that Rx refills may take up to 3 business days. We ask that you follow-up with your pharmacy.  Patient would like to cut dosage amount to 5MG  instead of 10 Mg

## 2024-11-08 NOTE — Telephone Encounter (Signed)
 Glipizide  5 mg sent to pharmacy

## 2024-11-08 NOTE — Addendum Note (Signed)
 Addended by: ANTONETTE ANGELINE ORN on: 11/08/2024 10:58 AM   Modules accepted: Orders

## 2025-02-15 ENCOUNTER — Ambulatory Visit: Admitting: Internal Medicine
